# Patient Record
Sex: Female | Born: 1985 | Race: White | Hispanic: No | Marital: Single | Smoking: Never smoker
Health system: Southern US, Community
[De-identification: ages and names within clinical notes are randomized; demographics above are authoritative.]

## PROBLEM LIST (undated history)

## (undated) ENCOUNTER — Inpatient Hospital Stay (HOSPITAL_COMMUNITY): Payer: Self-pay

## (undated) DIAGNOSIS — F329 Major depressive disorder, single episode, unspecified: Secondary | ICD-10-CM

## (undated) DIAGNOSIS — D649 Anemia, unspecified: Secondary | ICD-10-CM

## (undated) DIAGNOSIS — O139 Gestational [pregnancy-induced] hypertension without significant proteinuria, unspecified trimester: Secondary | ICD-10-CM

## (undated) DIAGNOSIS — F419 Anxiety disorder, unspecified: Secondary | ICD-10-CM

## (undated) DIAGNOSIS — F32A Depression, unspecified: Secondary | ICD-10-CM

## (undated) HISTORY — PX: NO PAST SURGERIES: SHX2092

---
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
## 2006-03-30 ENCOUNTER — Emergency Department (HOSPITAL_COMMUNITY): Admission: EM | Admit: 2006-03-30 | Discharge: 2006-03-31 | Payer: Self-pay | Admitting: Emergency Medicine

## 2006-11-08 ENCOUNTER — Emergency Department (HOSPITAL_COMMUNITY): Admission: EM | Admit: 2006-11-08 | Discharge: 2006-11-08 | Payer: Self-pay | Admitting: Emergency Medicine

## 2007-08-02 ENCOUNTER — Inpatient Hospital Stay (HOSPITAL_COMMUNITY): Admission: AD | Admit: 2007-08-02 | Discharge: 2007-08-02 | Payer: Self-pay | Admitting: Obstetrics and Gynecology

## 2007-08-19 ENCOUNTER — Emergency Department (HOSPITAL_COMMUNITY): Admission: EM | Admit: 2007-08-19 | Discharge: 2007-08-19 | Payer: Self-pay | Admitting: Emergency Medicine

## 2007-09-15 ENCOUNTER — Inpatient Hospital Stay (HOSPITAL_COMMUNITY): Admission: AD | Admit: 2007-09-15 | Discharge: 2007-09-17 | Payer: Self-pay | Admitting: Obstetrics & Gynecology

## 2010-12-07 ENCOUNTER — Encounter: Payer: Self-pay | Admitting: *Deleted

## 2010-12-07 ENCOUNTER — Emergency Department (HOSPITAL_COMMUNITY)
Admission: EM | Admit: 2010-12-07 | Discharge: 2010-12-07 | Discharge status: Against Medical Advice | Payer: Self-pay | Attending: Emergency Medicine | Admitting: Emergency Medicine

## 2010-12-07 DIAGNOSIS — M542 Cervicalgia: Secondary | ICD-10-CM | POA: Insufficient documentation

## 2010-12-07 NOTE — ED Notes (Signed)
Pt reports yesterday she woke up with pain and stiffness to neck and today pain has started to radiated to right arm associated with numbness. Pt reports she can only lift right arm to 90 degree angle. Pt is able to grab things, was able to get dressed.

## 2010-12-07 NOTE — ED Notes (Signed)
Attempt to call pt x2. No answer

## 2010-12-12 ENCOUNTER — Emergency Department (HOSPITAL_COMMUNITY): Payer: Self-pay

## 2010-12-12 ENCOUNTER — Emergency Department (HOSPITAL_COMMUNITY)
Admission: EM | Admit: 2010-12-12 | Discharge: 2010-12-12 | Disposition: A | Discharge status: Home / Self Care | Payer: Self-pay | Attending: Emergency Medicine | Admitting: Emergency Medicine

## 2010-12-12 ENCOUNTER — Encounter (HOSPITAL_COMMUNITY): Payer: Self-pay | Admitting: Emergency Medicine

## 2010-12-12 DIAGNOSIS — M436 Torticollis: Secondary | ICD-10-CM | POA: Insufficient documentation

## 2010-12-12 DIAGNOSIS — R51 Headache: Secondary | ICD-10-CM | POA: Insufficient documentation

## 2010-12-12 DIAGNOSIS — M542 Cervicalgia: Secondary | ICD-10-CM | POA: Insufficient documentation

## 2010-12-12 MED ORDER — DIAZEPAM 5 MG PO TABS: 5.0000 mg | ORAL_TABLET | Freq: Four times a day (QID) | ORAL | Status: AC | PRN

## 2010-12-12 MED ORDER — DIAZEPAM 5 MG PO TABS
5.0000 mg | ORAL_TABLET | Freq: Once | ORAL | Status: AC
  Administered 2010-12-12: 5 mg via ORAL
  Filled 2010-12-12: qty 1

## 2010-12-12 NOTE — ED Provider Notes (Signed)
History     CSN: [id number] Arrival date & time: 12/12/2010  1:44 PM   First MD Initiated Contact with Patient 12/12/10 1422      Chief Complaint  Patient presents with  . Torticollis  . Headache    (Consider location/radiation/quality/duration/timing/severity/associated sxs/prior treatment) HPI Comments: Patient reports midline and right sided neck pain with radiation into right arm. Pain began 6 days ago when she woke from sleep, felt like a "crick" in her neck, which has gotten worse, and she feels a "lump" in the muscle of her right upper back.   Reports one episode of almost dropping plates when pain became so intense that her arm "went out."  Denies current weakness or tingling in right arm.  Denies MVC or recent injury to the neck. Denies fevers.  Has taken ibuprofen without improvement.    Patient is a 25 y.o. female presenting with headaches. The history is provided by the patient.  Headache     History reviewed. No pertinent past medical history.  History reviewed. No pertinent past surgical history.  No family history on file.  History  Substance Use Topics  . Smoking status: Never Smoker   . Smokeless tobacco: Never Used  . Alcohol Use: No    OB History    Grav Para Term Preterm Abortions TAB SAB Ect Mult Living                  Review of Systems  Neurological: Positive for headaches.  All other systems reviewed and are negative.    Allergies  Calamine  Home Medications   Current Outpatient Rx  Name Route Sig Dispense Refill  . IBUPROFEN 200 MG PO TABS Oral Take 800 mg by mouth every 6 (six) hours as needed. For pain      BP 116/77  Pulse 88  Temp(Src) 98.6 F (37 C) (Oral)  Resp 20  Ht 5\' 7"  (1.702 m)  Wt 200 lb (90.719 kg)  BMI 31.32 kg/m2  SpO2 99%  LMP 11/11/2010  Physical Exam  Vitals reviewed. Constitutional: She is oriented to person, place, and time. She appears well-developed and well-nourished.  HENT:  Head: Normocephalic  and atraumatic.  Neck: Neck supple. No tracheal deviation present.    Cardiovascular: Normal rate and regular rhythm.   Pulmonary/Chest: Effort normal and breath sounds normal. No stridor. No respiratory distress. She has no wheezes.  Neurological: She is alert and oriented to person, place, and time.    ED Course  Procedures (including critical care time)  Labs Reviewed - No data to display No results found.   1. Torticollis       MDM  Patient woke up with pain in neck, exacerbated by turning head to right, muscle spasms on exam.          Rise Patience, PA 12/13/10 0023

## 2010-12-12 NOTE — ED Notes (Signed)
Right neck and shoulder pain, pain is now shooting through shoulder blade, unable to take in deep breath, and has headache

## 2010-12-13 NOTE — ED Provider Notes (Signed)
Medical screening examination/treatment/procedure(s) were performed by non-physician practitioner and as supervising physician I was immediately available for consultation/collaboration.   Dayton Bailiff, MD 12/13/10 [phone]

## 2011-04-09 ENCOUNTER — Emergency Department (INDEPENDENT_AMBULATORY_CARE_PROVIDER_SITE_OTHER)
Admission: EM | Admit: 2011-04-09 | Discharge: 2011-04-09 | Disposition: A | Discharge status: Home Health | Payer: BC Managed Care – PPO | Source: Home / Self Care | Attending: Family Medicine | Admitting: Family Medicine

## 2011-04-09 ENCOUNTER — Encounter (HOSPITAL_COMMUNITY): Payer: Self-pay | Admitting: Emergency Medicine

## 2011-04-09 ENCOUNTER — Emergency Department (INDEPENDENT_AMBULATORY_CARE_PROVIDER_SITE_OTHER): Payer: BC Managed Care – PPO

## 2011-04-09 ENCOUNTER — Other Ambulatory Visit: Payer: Self-pay

## 2011-04-09 DIAGNOSIS — K2970 Gastritis, unspecified, without bleeding: Secondary | ICD-10-CM

## 2011-04-09 MED ORDER — ONDANSETRON HCL 4 MG PO TABS: 4.0000 mg | ORAL_TABLET | Freq: Three times a day (TID) | ORAL | Status: AC | PRN

## 2011-04-09 MED ORDER — SUCRALFATE 1 G PO TABS: 1.0000 g | ORAL_TABLET | Freq: Four times a day (QID) | ORAL | Status: AC

## 2011-04-09 NOTE — ED Notes (Signed)
PT HERE WITH C/O LEFT CHEST WALL SHARP INTERMITT CP RADIATING TO LEFT ARM X 2 MNTHS BUT WAS CONCERNED TODAY AFTER X 2 EPISODES COUGHING UP SPECKS BRIGHT RED BLOOD.SX CONSTANT AT REST AND SORE TO TOUCH.DENIES SOB.VSS.PT ALSO STATES HEART ATTACKS RUN IN FAMILY

## 2011-04-09 NOTE — Discharge Instructions (Signed)
Gastritis Gastritis is an irritation of the stomach. This is often caused by medications, but can be from anything that bothers the stomach. Other stomach irritants are:  Alcohol.   Caffeine.   Nicotine.   Spicy or acid foods.   Medications for pain and arthritis. Aspirin and other anti-inflammatory medicines such as ibuprofen (Advil), naproxen (Aleve), and ketoprofen (Orudis) can be highly irritating.   Emotional distress.  Symptoms of gastritis may include:  Abdominal pain.   Indigestion.   Nausea and or vomiting.   Bleeding.  Some patients with chronic gastritis and ulcers have been infected by a germ. They may need special testing. Medications which kill germs can be used to cure this condition. Treatment includes avoiding the substances mentioned above that are known to cause stomach trouble. Medications used to treat gastritis can include:  Antacids.   Medicines to control vomiting.   Acid blocking medicines.  Symptoms of gastritis usually improve within 2-3 days of starting treatment. Call your caregiver if you are not better in a few days. SEEK MEDICAL CARE IF:   You have increased stomach or chest pain.   You vomit blood.   You faint or feel lightheaded.   You cannot keep fluids down.   You pass bloody or black stools.   You develop severe back pain.  MAKE SURE YOU:   Understand these instructions.   Will watch your condition.   Will get help right away if you are not doing well or get worse.  Document Released: 01/14/2005 Document Revised: 01/03/2011 Document Reviewed: 07/02/2006 [employer] Patient Information 2012 [employer]. 

## 2011-04-10 NOTE — ED Provider Notes (Signed)
History     CSN: [id number]  Arrival date & time 04/09/11  1843   First MD Initiated Contact with Patient 04/09/11 1918      Chief Complaint  Patient presents with  . Chest Pain  . Hemoptysis    (Consider location/radiation/quality/duration/timing/severity/associated sxs/prior treatment) HPI Comments: 26 y/o female with no significant PMH. Here c/o nausea, vomitting x2 this morning with specs of blood, associated with epigastric and retrosternal burning type of pain. Symptoms for 2 days. 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 Denies diaphoresis, heart palpitations or shortness of breath. No syncope. No cough or pleuritic pain. Denies cigarett or alcohol intake. She does have intermittent 800 mg ibuprofen intake for neck pain. No melena.   History reviewed. No pertinent past medical history.  History reviewed. No pertinent past surgical history.  Family History  Problem Relation Age of Onset  . Diabetes Brother   . Heart failure Other     History  Substance Use Topics  . Smoking status: Never Smoker   . Smokeless tobacco: Never Used  . Alcohol Use: No    OB History    Grav Para Term Preterm Abortions TAB SAB Ect Mult Living                  Review of Systems  Constitutional: Positive for appetite change. Negative for fever, chills and diaphoresis.  HENT: Negative for congestion, sore throat and neck pain.   Respiratory: Negative for cough and shortness of breath.   Cardiovascular: Positive for chest pain. Negative for palpitations and leg swelling.  Gastrointestinal: Positive for nausea, vomiting and abdominal pain. Negative for diarrhea, blood in stool and abdominal distention.  Genitourinary: Negative for dysuria, frequency, flank pain, vaginal  discharge and pelvic pain.  Skin: Negative for rash.  Neurological: Negative for dizziness.    Allergies  Calamine  Home Medications   Current Outpatient Rx  Name Route Sig Dispense Refill  . IBUPROFEN 200 MG PO TABS Oral Take 800 mg by mouth every 6 (six) hours as needed. For pain    . ONDANSETRON HCL 4 MG PO TABS Oral Take 1 tablet (4 mg total) by mouth every 8 (eight) hours as needed for nausea. 10 tablet 0  . SUCRALFATE 1 G PO TABS Oral Take 1 tablet (1 g total) by mouth 4 (four) times daily. Take 30 min before meals and before bed time 28 tablet 0    BP 121/77  Pulse 90  Temp(Src) 98.7 F (37.1 C) (Oral)  Resp 16  SpO2 100%  LMP 03/14/2011  Physical Exam  Nursing note and vitals reviewed. Constitutional: She is oriented to person, place, and time. She appears well-developed and well-nourished. No distress.  HENT:  Head: Normocephalic and atraumatic.  Mouth/Throat: Oropharynx is clear and moist. No oropharyngeal exudate.  Eyes: Conjunctivae are normal. Pupils are equal, round, and reactive to light. No scleral icterus.  Neck: Normal range of motion. Neck supple. No JVD present.  Cardiovascular: Normal rate, regular rhythm, normal heart sounds and intact distal pulses.  Exam reveals no gallop.   No murmur heard. Pulmonary/Chest: Effort normal and breath sounds normal. No respiratory distress. She has no wheezes. She has no rales.  Abdominal: Soft. Bowel sounds are normal. She  exhibits no distension and no mass. There is no rebound and no guarding.       Epigastric tenderness with deep palpation. Negative Murphy's sign.  Musculoskeletal:       Left shoulder with FROM. Left arm appears neurovascularly intact.  Neurological: She is alert and oriented to person, place, and time.  Skin: No rash noted. She is not diaphoretic.    ED Course  Procedures (including critical care time)   Labs Reviewed  POCT PREGNANCY, URINE  LAB REPORT - SCANNED   Dg Chest 2  View  04/09/2011  *RADIOLOGY REPORT*  Clinical Data: Chest pain.  Hemoptysis.  Shortness of breath.  Left arm pain.  CHEST - 2 VIEW  Comparison: None.  Findings: Cardiomediastinal silhouette is within normal limits. The lungs are free of focal consolidations and pleural effusions. No edema. Visualized osseous structures have a normal appearance.  IMPRESSION: Negative exam.  Original Report Authenticated By: Patterson Hammersmith, M.D.     1. Gastritis       MDM  EKG with normal rate and rithm no ischemic changes. Impress gastritis related symptoms. Reccommended to stop ibuprofen. Neg pregnancy test.        Sharin Grave, MD 04/10/11 1025

## 2012-05-10 ENCOUNTER — Emergency Department (INDEPENDENT_AMBULATORY_CARE_PROVIDER_SITE_OTHER)
Admission: EM | Admit: 2012-05-10 | Discharge: 2012-05-10 | Disposition: A | Discharge status: Home / Self Care | Payer: BC Managed Care – PPO | Source: Home / Self Care | Attending: Family Medicine | Admitting: Family Medicine

## 2012-05-10 ENCOUNTER — Encounter (HOSPITAL_COMMUNITY): Payer: Self-pay | Admitting: *Deleted

## 2012-05-10 DIAGNOSIS — L03113 Cellulitis of right upper limb: Secondary | ICD-10-CM

## 2012-05-10 DIAGNOSIS — IMO0002 Reserved for concepts with insufficient information to code with codable children: Secondary | ICD-10-CM

## 2012-05-10 MED ORDER — LIDOCAINE HCL (PF) 1 % IJ SOLN
INTRAMUSCULAR | Status: AC
  Filled 2012-05-10: qty 5

## 2012-05-10 MED ORDER — CEFTRIAXONE SODIUM 1 G IJ SOLR
1.0000 g | Freq: Once | INTRAMUSCULAR | Status: AC
  Administered 2012-05-10: 1 g via INTRAMUSCULAR

## 2012-05-10 MED ORDER — HYDROXYZINE HCL 50 MG PO TABS: 50.0000 mg | ORAL_TABLET | Freq: Three times a day (TID) | ORAL | Status: DC | PRN

## 2012-05-10 MED ORDER — METHYLPREDNISOLONE SODIUM SUCC 125 MG IJ SOLR
125.0000 mg | Freq: Once | INTRAMUSCULAR | Status: AC
  Administered 2012-05-10: 125 mg via INTRAMUSCULAR

## 2012-05-10 MED ORDER — IBUPROFEN 600 MG PO TABS: 600.0000 mg | ORAL_TABLET | Freq: Three times a day (TID) | ORAL | Status: DC

## 2012-05-10 MED ORDER — METHYLPREDNISOLONE SODIUM SUCC 125 MG IJ SOLR
INTRAMUSCULAR | Status: AC
  Filled 2012-05-10: qty 2

## 2012-05-10 MED ORDER — CEFTRIAXONE SODIUM 1 G IJ SOLR
INTRAMUSCULAR | Status: AC
  Filled 2012-05-10: qty 10

## 2012-05-10 MED ORDER — CEPHALEXIN 500 MG PO CAPS: 500.0000 mg | ORAL_CAPSULE | Freq: Two times a day (BID) | ORAL | Status: DC

## 2012-05-10 MED ORDER — TRIAMCINOLONE ACETONIDE 0.5 % EX OINT: TOPICAL_OINTMENT | Freq: Two times a day (BID) | CUTANEOUS | Status: DC

## 2012-05-10 NOTE — ED Provider Notes (Signed)
History     CSN: [id number]  Arrival date & time 05/10/12  1217   First MD Initiated Contact with Patient 05/10/12 1326      Chief Complaint  Patient presents with  . Insect Bite    (Consider location/radiation/quality/duration/timing/severity/associated sxs/prior treatment) HPI Comments: 27 year old female nondiabetic. Here complaining of right arm redness, swelling, itchiness and pain for 24 hours. Patient states she had an insect bite as a trigger of her symptoms. No drainage. No fever or chills. No general malaise. No abdominal pain, nausea or vomiting.   History reviewed. No pertinent past medical history.  History reviewed. No pertinent past surgical history.  Family History  Problem Relation Age of Onset  . Diabetes Brother   . Heart failure Other     History  Substance Use Topics  . Smoking status: Never Smoker   . Smokeless tobacco: Never Used  . Alcohol Use: No    OB History   Grav Para Term Preterm Abortions TAB SAB Ect Mult Living                  Review of Systems  Constitutional: Negative for fever and chills.  Gastrointestinal: Negative for nausea and vomiting.  Skin: Positive for rash.  Neurological: Negative for dizziness.  All other systems reviewed and are negative.    Allergies  Calamine  Home Medications   Current Outpatient Rx  Name  Route  Sig  Dispense  Refill  . cephALEXin (KEFLEX) 500 MG capsule   Oral   Take 1 capsule (500 mg total) by mouth 2 (two) times daily.   20 capsule   0   . hydrOXYzine (ATARAX/VISTARIL) 50 MG tablet   Oral   Take 1 tablet (50 mg total) by mouth every 8 (eight) hours as needed for itching.   20 tablet   0   . ibuprofen (ADVIL,MOTRIN) 600 MG tablet   Oral   Take 1 tablet (600 mg total) by mouth 3 (three) times daily.   30 tablet   0   . triamcinolone ointment (KENALOG) 0.5 %   Topical   Apply topically 2 (two) times daily.   30 g   0     BP 130/85  Pulse 80  Temp(Src) 97.9 F (36.6  C) (Oral)  Resp 20  SpO2 100%  LMP 04/09/2012  Physical Exam  Nursing note and vitals reviewed. Constitutional: She is oriented to person, place, and time. She appears well-developed and well-nourished. No distress.  HENT:  Head: Normocephalic and atraumatic.  Cardiovascular: Normal rate, regular rhythm and normal heart sounds.   Pulmonary/Chest: Breath sounds normal.  Lymphadenopathy:    She has no cervical adenopathy.  Neurological: She is alert and oriented to person, place, and time.  Skin: Rash noted. She is not diaphoretic.  Large area of translucent pruriginous erythema and swelling and right inner forearm. Mildly tender to palpation and warm to touch. There is a central area with subtle darkening of the skin otherwise there is no pustules or skin breaks. No induration or fluctuation. No streaking. Area was marked and dated with a demographic pen.    ED Course  Procedures (including critical care time)  Labs Reviewed - No data to display No results found.   1. Cellulitis of right forearm       MDM  Erythema was marked with a demographic pen. Patient had administered Solu-Medrol 125 mg IM x1 and Rocephin 1 g IM x1.  Prescribed Keflex and hydroxyzine. Asked to return in  24 or 48 hours for recheck. Supportive care and red flags should prompt her return to medical attention discussed with patient and provided in writing.       Sharin Grave, MD 05/10/12 1747

## 2012-05-10 NOTE — ED Notes (Signed)
Pt reports being possible spider bite since yesterday on lower right arm - redness with mark in middle, streaking up arm - sensitive to touch.

## 2012-05-31 ENCOUNTER — Encounter (HOSPITAL_COMMUNITY): Payer: Self-pay | Admitting: Emergency Medicine

## 2012-05-31 ENCOUNTER — Emergency Department (INDEPENDENT_AMBULATORY_CARE_PROVIDER_SITE_OTHER)
Admission: EM | Admit: 2012-05-31 | Discharge: 2012-05-31 | Disposition: A | Discharge status: Home / Self Care | Payer: BC Managed Care – PPO | Source: Home / Self Care | Attending: Emergency Medicine | Admitting: Emergency Medicine

## 2012-05-31 DIAGNOSIS — J020 Streptococcal pharyngitis: Secondary | ICD-10-CM

## 2012-05-31 DIAGNOSIS — IMO0002 Reserved for concepts with insufficient information to code with codable children: Secondary | ICD-10-CM

## 2012-05-31 LAB — POCT RAPID STREP A: Streptococcus, Group A Screen (Direct): POSITIVE — AB

## 2012-05-31 MED ORDER — AMOXICILLIN 500 MG PO CAPS: 500.0000 mg | ORAL_CAPSULE | Freq: Three times a day (TID) | ORAL | Status: AC

## 2012-05-31 NOTE — ED Provider Notes (Addendum)
History     CSN: [id number]  Arrival date & time 05/31/12  1222   First MD Initiated Contact with Patient 05/31/12 1315      Chief Complaint  Patient presents with  . Sore Throat    sore throat x 3 days. throat is dry and itchy with white patches. neck sorness. itcy ears    (Consider location/radiation/quality/duration/timing/severity/associated sxs/prior treatment) HPI Comments: Patient presents urgent care describing that for about 3 days she's been having a sore throat of her throat is itchy and swollen with white patches and hurts even more this morning swallow. Her neck feels sore (patient points to right anterior cervical area). Feeling somewhat tired fatigued, her youngest son is also having similar symptoms. She has taken some ibuprofen for her discomfort  Patient is a 27 y.o. female presenting with pharyngitis. The history is provided by the patient.  Sore Throat This is a new problem. The problem occurs constantly. Pertinent negatives include no abdominal pain, no headaches and no shortness of breath. The symptoms are aggravated by swallowing. Nothing relieves the symptoms. She has tried nothing for the symptoms. The treatment provided no relief.    History reviewed. No pertinent past medical history.  History reviewed. No pertinent past surgical history.  Family History  Problem Relation Age of Onset  . Diabetes Brother   . Heart failure Other     History  Substance Use Topics  . Smoking status: Never Smoker   . Smokeless tobacco: Never Used  . Alcohol Use: No    OB History   Grav Para Term Preterm Abortions TAB SAB Ect Mult Living                  Review of Systems  Constitutional: Negative for fever, chills, appetite change and fatigue.  HENT: Positive for sore throat and trouble swallowing. Negative for ear pain, facial swelling, neck pain, neck stiffness, postnasal drip and sinus pressure.   Eyes: Negative for pain.  Respiratory: Negative for shortness  of breath.   Gastrointestinal: Negative for abdominal pain.  Skin: Negative for rash.  Neurological: Negative for headaches.    Allergies  Calamine  Home Medications   Current Outpatient Rx  Name  Route  Sig  Dispense  Refill  . amoxicillin (AMOXIL) 500 MG capsule   Oral   Take 1 capsule (500 mg total) by mouth 3 (three) times daily.   30 capsule   0   . ibuprofen (ADVIL,MOTRIN) 600 MG tablet   Oral   Take 1 tablet (600 mg total) by mouth 3 (three) times daily.   30 tablet   0   . triamcinolone ointment (KENALOG) 0.5 %   Topical   Apply topically 2 (two) times daily.   30 g   0     BP 123/80  Pulse 94  Temp(Src) 99.4 F (37.4 C) (Oral)  Resp 18  SpO2 100%  LMP 05/17/2012  Physical Exam  Nursing note and vitals reviewed. Constitutional: Vital signs are normal. She appears well-developed and well-nourished.  Non-toxic appearance. She does not have a sickly appearance. She does not appear ill. No distress.  HENT:  Head: Normocephalic.  Right Ear: Tympanic membrane normal.  Left Ear: Tympanic membrane normal.  Mouth/Throat: Oropharyngeal exudate present.  Eyes: Conjunctivae are normal. Right eye exhibits no discharge. Left eye exhibits no discharge. No scleral icterus.  Neck: Trachea normal. Neck supple. No JVD present. No thyromegaly present.  Lymphadenopathy:    She has cervical adenopathy.  Right cervical: Superficial cervical adenopathy present.       Left cervical: Superficial cervical adenopathy present.  Skin: No rash noted. No erythema.    ED Course  Procedures (including critical care time)  Labs Reviewed  POCT RAPID STREP A ([facility] ONLY) - Abnormal; Notable for the following:    Streptococcus, Group A Screen (Direct) POSITIVE (*)    All other components within normal limits   No results found.   1. Pharyngitis due to Streptoccus pyogenes       MDM  Uncomplicated streptococcal pharyngitis. Patient will be treated with a course  of amoxicillin for 10 days, she has a child that has also been treated despite negative strep test. Based on center criteria and clinical suspicion mother was informed of such rationale, she agreed.        Jimmie Molly, MD 05/31/12 1355  Jimmie Molly, MD 05/31/12 1356  Jimmie Molly, MD 05/31/12 1356

## 2012-05-31 NOTE — ED Notes (Signed)
Pt c/o sore throat. "throat is itchy and very dry" white patches.  Hurts to swallow. Neck soreness. Pt has not tried any otc meds for treatment. Denies any other symptoms. Symptoms present x 3 days

## 2012-07-05 ENCOUNTER — Emergency Department: Payer: Self-pay | Admitting: Unknown Physician Specialty

## 2012-09-17 LAB — OB RESULTS CONSOLE HEPATITIS B SURFACE ANTIGEN: Hepatitis B Surface Ag: NEGATIVE

## 2012-09-17 LAB — OB RESULTS CONSOLE RPR: RPR: NONREACTIVE

## 2012-09-17 LAB — OB RESULTS CONSOLE RUBELLA ANTIBODY, IGM: Rubella: IMMUNE

## 2012-09-17 LAB — OB RESULTS CONSOLE GC/CHLAMYDIA
Chlamydia: NEGATIVE
GC PROBE AMP, GENITAL: NEGATIVE

## 2012-09-17 LAB — OB RESULTS CONSOLE ABO/RH: RH TYPE: POSITIVE

## 2012-09-17 LAB — OB RESULTS CONSOLE HIV ANTIBODY (ROUTINE TESTING): HIV: NONREACTIVE

## 2012-10-13 ENCOUNTER — Emergency Department (HOSPITAL_COMMUNITY)
Admission: EM | Admit: 2012-10-13 | Discharge: 2012-10-13 | Disposition: A | Discharge status: Home / Self Care | Payer: Medicaid Other | Attending: Emergency Medicine | Admitting: Emergency Medicine

## 2012-10-13 ENCOUNTER — Encounter (HOSPITAL_COMMUNITY): Payer: Self-pay | Admitting: *Deleted

## 2012-10-13 DIAGNOSIS — R05 Cough: Secondary | ICD-10-CM

## 2012-10-13 DIAGNOSIS — Z79899 Other long term (current) drug therapy: Secondary | ICD-10-CM | POA: Insufficient documentation

## 2012-10-13 DIAGNOSIS — J069 Acute upper respiratory infection, unspecified: Secondary | ICD-10-CM

## 2012-10-13 DIAGNOSIS — R059 Cough, unspecified: Secondary | ICD-10-CM | POA: Insufficient documentation

## 2012-10-13 DIAGNOSIS — O9989 Other specified diseases and conditions complicating pregnancy, childbirth and the puerperium: Secondary | ICD-10-CM | POA: Insufficient documentation

## 2012-10-13 NOTE — ED Notes (Signed)
Reports having productive cough x 1 week with yellow sputum production. Airway intact.

## 2012-10-13 NOTE — ED Provider Notes (Signed)
CSN: [id number]     Arrival date & time 10/13/12  1858 History   This chart was scribed for non-physician practitioner, Johnnette Gourd, PA, working with Donnetta Hutching, MD, by [facility] ED Scribe. This patient was seen in room TR07C/TR07C and the patient's care was started at 7:15 PM.  Chief Complaint  Patient presents with  . Cough    The history is provided by the patient. No language interpreter was used.    HPI Comments: Caroline Taylor is a 27 y.o. Female, who is [redacted] weeks pregnant who presents to the Emergency Department complaining of a cough productive of yellow mucus over the past week. She reports associated chest congestion, subjective fever and chills over the past week. Pt has taken OTC cough medication with minimal relief. Ob/gyn told her this cough medication was safe in her pregnancy. Pt states she has experienced wheezing depending on cough.   History reviewed. No pertinent past medical history. History reviewed. No pertinent past surgical history.  Family History  Problem Relation Age of Onset  . Diabetes Brother   . Heart failure Other    History  Substance Use Topics  . Smoking status: Never Smoker   . Smokeless tobacco: Never Used  . Alcohol Use: No   OB History   Grav Para Term Preterm Abortions TAB SAB Ect Mult Living   2         1     Review of Systems  Constitutional: Positive for fever and chills.  HENT: Positive for congestion.   Respiratory: Positive for cough.   All other systems reviewed and are negative.   Allergies  Calamine  Home Medications   Current Outpatient Rx  Name  Route  Sig  Dispense  Refill  . Prenatal Vit-Fe Fumarate-FA (PRENATAL MULTIVITAMIN) TABS tablet   Oral   Take 1 tablet by mouth daily.          Triage Vitals: P 133/83  Pulse 95  Temp(Src) 98.7 F (37.1 C) (Oral)  Resp 18  SpO2 98%  LMP 05/17/2012  Physical Exam  Nursing note and vitals reviewed. Constitutional: She is oriented to person, place, and time. She  appears well-developed and well-nourished. No distress.  HENT:  Head: Normocephalic and atraumatic.  Mouth/Throat: Oropharynx is clear and moist.  Mucosal edema and clear rhinorrhea. Postnasal drip. No adenopathy.   Eyes: Conjunctivae and EOM are normal.  Neck: Normal range of motion. Neck supple.  Cardiovascular: Normal rate, regular rhythm and normal heart sounds.   Pulmonary/Chest: Effort normal and breath sounds normal. No respiratory distress. She has no wheezes. She has no rales. She exhibits no tenderness.  Musculoskeletal: Normal range of motion. She exhibits no edema.  Neurological: She is alert and oriented to person, place, and time. No sensory deficit.  Skin: Skin is warm and dry.  Psychiatric: She has a normal mood and affect. Her behavior is normal.    ED Course  Procedures (including critical care time)  DIAGNOSTIC STUDIES: Oxygen Saturation is 98% on RA, normal by my interpretation.    COORDINATION OF CARE: 7:20 PM- Pt advised of plan to rest and to use nasal saline. Pt agrees with plan.  Labs Review Labs Reviewed - No data to display Imaging Review No results found.  MDM   1. Cough   2. URI (upper respiratory infection)    Patient with URI. She is well appearing and in no apparent distress with normal vital signs. She is [redacted] weeks pregnant. I advised her to  rest, increase fluids, use nasal saline rinses. She can continue using cough medication was approved by the OB/GYN. Return precautions discussed. Patient states understanding of plan and is agreeable.   I personally performed the services described in this documentation, which was scribed in my presence. The recorded information has been reviewed and is accurate.    Trevor Mace, PA-C 10/13/12 2336

## 2012-10-14 NOTE — ED Provider Notes (Signed)
Medical screening examination/treatment/procedure(s) were performed by non-physician practitioner and as supervising physician I was immediately available for consultation/collaboration.  Donnetta Hutching, MD 10/14/12 [phone]

## 2013-01-13 LAB — OB RESULTS CONSOLE RPR: RPR: NONREACTIVE

## 2013-01-28 NOTE — L&D Delivery Note (Signed)
Patient was C/C/+2 and pushed for <5 minutes with epidural.   NSVD  female infant, Apgars 9/9, weight pending.   The patient had bilateral periurethral abrasions not requiring repair and a 1st degree Right vaginal repaired with 3-0 vicryl. Fundus was firm. EBL was estimated to be 600cc, she received 0.2mg  methergine IM x 1 dose and 1000mcg cytotec rectally; manual evacuation of clot was performed with resolution of heavy bleeding. Placenta was delivered intact. Vagina was clear.  Baby was vigorous and doing skin to skin with mother.  Philip AspenALLAHAN, Ralphine Hinks

## 2013-03-28 ENCOUNTER — Inpatient Hospital Stay (HOSPITAL_COMMUNITY)
Admission: AD | Admit: 2013-03-28 | Discharge: 2013-03-28 | Disposition: A | Discharge status: Home / Self Care | Payer: Medicaid Other | Source: Ambulatory Visit | Attending: Obstetrics and Gynecology | Admitting: Obstetrics and Gynecology

## 2013-03-28 ENCOUNTER — Encounter (HOSPITAL_COMMUNITY): Payer: Self-pay | Admitting: *Deleted

## 2013-03-28 DIAGNOSIS — O9989 Other specified diseases and conditions complicating pregnancy, childbirth and the puerperium: Principal | ICD-10-CM

## 2013-03-28 DIAGNOSIS — O99891 Other specified diseases and conditions complicating pregnancy: Secondary | ICD-10-CM | POA: Insufficient documentation

## 2013-03-28 DIAGNOSIS — O139 Gestational [pregnancy-induced] hypertension without significant proteinuria, unspecified trimester: Secondary | ICD-10-CM | POA: Insufficient documentation

## 2013-03-28 DIAGNOSIS — W009XXA Unspecified fall due to ice and snow, initial encounter: Secondary | ICD-10-CM

## 2013-03-28 DIAGNOSIS — R109 Unspecified abdominal pain: Secondary | ICD-10-CM | POA: Insufficient documentation

## 2013-03-28 DIAGNOSIS — W010XXA Fall on same level from slipping, tripping and stumbling without subsequent striking against object, initial encounter: Secondary | ICD-10-CM | POA: Insufficient documentation

## 2013-03-28 DIAGNOSIS — H538 Other visual disturbances: Secondary | ICD-10-CM

## 2013-03-28 LAB — URINALYSIS, ROUTINE W REFLEX MICROSCOPIC
Bilirubin Urine: NEGATIVE
Glucose, UA: NEGATIVE mg/dL
HGB URINE DIPSTICK: NEGATIVE
Ketones, ur: NEGATIVE mg/dL
LEUKOCYTES UA: NEGATIVE
NITRITE: NEGATIVE
Protein, ur: NEGATIVE mg/dL
UROBILINOGEN UA: 1 mg/dL (ref 0.0–1.0)
pH: 6 (ref 5.0–8.0)

## 2013-03-28 LAB — COMPREHENSIVE METABOLIC PANEL
ALBUMIN: 2.3 g/dL — AB (ref 3.5–5.2)
ALK PHOS: 139 U/L — AB (ref 39–117)
ALT: 9 U/L (ref 0–35)
AST: 16 U/L (ref 0–37)
BILIRUBIN TOTAL: 0.3 mg/dL (ref 0.3–1.2)
BUN: 11 mg/dL (ref 6–23)
CHLORIDE: 101 meq/L (ref 96–112)
CO2: 23 meq/L (ref 19–32)
Calcium: 8.9 mg/dL (ref 8.4–10.5)
Creatinine, Ser: 0.78 mg/dL (ref 0.50–1.10)
GFR calc Af Amer: >90 mL/min (ref >90)
Glucose, Bld: 91 mg/dL (ref 70–99)
POTASSIUM: 4.6 meq/L (ref 3.7–5.3)
SODIUM: 135 meq/L — AB (ref 137–147)
Total Protein: 6.1 g/dL (ref 6.0–8.3)

## 2013-03-28 LAB — CBC
HCT: 32.7 % — ABNORMAL LOW (ref 36.0–46.0)
Hemoglobin: 10.5 g/dL — ABNORMAL LOW (ref 12.0–15.0)
MCH: 27.9 pg (ref 26.0–34.0)
MCHC: 32.1 g/dL (ref 30.0–36.0)
MCV: 87 fL (ref 78.0–100.0)
PLATELETS: 256 10*3/uL (ref 150–400)
RBC: 3.76 MIL/uL — AB (ref 3.87–5.11)
RDW: 14 % (ref 11.5–15.5)
WBC: 8.2 10*3/uL (ref 4.0–10.5)

## 2013-03-28 LAB — LACTATE DEHYDROGENASE: LDH: 199 U/L (ref 94–250)

## 2013-03-28 LAB — URIC ACID: Uric Acid, Serum: 6.4 mg/dL (ref 2.4–7.0)

## 2013-03-28 NOTE — MAU Provider Note (Signed)
Chief Complaint:  Contractions, Fall and Blurred Vision   First Provider Initiated Contact with Patient 03/28/13 1817      HPI: Caroline Taylor is a 28 y.o. G2P1 at 60w1dwho presents to maternity admissions reporting onset of bilateral sharp intermittent lower abdominal pain this morning, a fall outside on wet/ice at 3pm today, and report of seeing spots x3 weeks.  She caught herself on her hands and knees during the fall and did not hit her abdomen.  She denies h/a or epigastric pain and reports she has had normal blood pressures so far in her pregnancy.  She reports good fetal movement, denies LOF, vaginal bleeding, vaginal itching/burning, urinary symptoms, h/a, dizziness, n/v, or fever/chills.    Past Medical History: History reviewed. No pertinent past medical history.  Past obstetric history: OB History  Gravida Para Term Preterm AB SAB TAB Ectopic Multiple Living  2 1        1     # Outcome Date GA Lbr Len/2nd Weight Sex Delivery Anes PTL Lv  2 CUR           1 PAR 09/15/07    M SVD EPI  Y      Past Surgical History: History reviewed. No pertinent past surgical history.  Family History: Family History  Problem Relation Age of Onset  . Diabetes Brother   . Heart failure Other     Social History: History  Substance Use Topics  . Smoking status: Never Smoker   . Smokeless tobacco: Never Used  . Alcohol Use: No    Allergies:  Allergies  Allergen Reactions  . Calamine Rash    Meds:  Prescriptions prior to admission  Medication Sig Dispense Refill  . ibuprofen (ADVIL,MOTRIN) 200 MG tablet Take 200 mg by mouth every 6 (six) hours as needed for headache.      . Prenatal Vit-Fe Fumarate-FA (PRENATAL MULTIVITAMIN) TABS tablet Take 1 tablet by mouth daily.        ROS: Pertinent findings in history of present illness.  Physical Exam  Blood pressure 130/79, pulse 76, temperature 98 F (36.7 C), temperature source Oral, resp. rate 18, height 5\' 7"  (1.702 m), weight 120.203  kg (265 lb), last menstrual period 07/18/2012. GENERAL: Well-developed, well-nourished female in no acute distress.  HEENT: normocephalic HEART: normal rate RESP: normal effort ABDOMEN: Soft, non-tender, gravid appropriate for gestational age EXTREMITIES: Nontender, no edema NEURO: alert and oriented Dilation: Closed Effacement (%): 30 Cervical Position: Posterior Station: -3 Exam by:: L. Munford RN FHT:  Baseline 130, moderate variability, accelerations present, no decelerations Contractions: None on toco or to palpation   Labs: Results for orders placed during the hospital encounter of 03/28/13 (from the past 24 hour(s))  URINALYSIS, ROUTINE W REFLEX MICROSCOPIC     Status: Abnormal   Collection Time    03/28/13  5:46 PM      Result Value Ref Range   Color, Urine YELLOW  YELLOW   APPearance CLEAR  CLEAR   Specific Gravity, Urine >1.030 (*) 1.005 - 1.030   pH 6.0  5.0 - 8.0   Glucose, UA NEGATIVE  NEGATIVE mg/dL   Hgb urine dipstick NEGATIVE  NEGATIVE   Bilirubin Urine NEGATIVE  NEGATIVE   Ketones, ur NEGATIVE  NEGATIVE mg/dL   Protein, ur NEGATIVE  NEGATIVE mg/dL   Urobilinogen, UA 1.0  0.0 - 1.0 mg/dL   Nitrite NEGATIVE  NEGATIVE   Leukocytes, UA NEGATIVE  NEGATIVE  CBC     Status: Abnormal   Collection  Time    03/28/13  6:25 PM      Result Value Ref Range   WBC 8.2  4.0 - 10.5 K/uL   RBC 3.76 (*) 3.87 - 5.11 MIL/uL   Hemoglobin 10.5 (*) 12.0 - 15.0 g/dL   HCT 16.132.7 (*) 09.636.0 - 04.546.0 %   MCV 87.0  78.0 - 100.0 fL   MCH 27.9  26.0 - 34.0 pg   MCHC 32.1  30.0 - 36.0 g/dL   RDW 40.914.0  81.111.5 - 91.415.5 %   Platelets 256  150 - 400 K/uL  COMPREHENSIVE METABOLIC PANEL     Status: Abnormal   Collection Time    03/28/13  6:25 PM      Result Value Ref Range   Sodium 135 (*) 137 - 147 mEq/L   Potassium 4.6  3.7 - 5.3 mEq/L   Chloride 101  96 - 112 mEq/L   CO2 23  19 - 32 mEq/L   Glucose, Bld 91  70 - 99 mg/dL   BUN 11  6 - 23 mg/dL   Creatinine, Ser 7.820.78  0.50 - 1.10 mg/dL    Calcium 8.9  8.4 - 95.610.5 mg/dL   Total Protein 6.1  6.0 - 8.3 g/dL   Albumin 2.3 (*) 3.5 - 5.2 g/dL   AST 16  0 - 37 U/L   ALT 9  0 - 35 U/L   Alkaline Phosphatase 139 (*) 39 - 117 U/L   Total Bilirubin 0.3  0.3 - 1.2 mg/dL   GFR calc non Af Amer >90  >90 mL/min   GFR calc Af Amer >90  >90 mL/min  URIC ACID     Status: None   Collection Time    03/28/13  6:25 PM      Result Value Ref Range   Uric Acid, Serum 6.4  2.4 - 7.0 mg/dL  LACTATE DEHYDROGENASE     Status: None   Collection Time    03/28/13  6:25 PM      Result Value Ref Range   LDH 199  94 - 250 U/L      Assessment: 1. Blurred vision, bilateral   2. Fall due to ice or snow   3. Transient hypertension of pregnancy     Plan: Consult Dr Dareen PianoAnderson Discharge home Labor precautions and fetal kick counts Keep scheduled appointment this week in office Return to MAU as needed      Follow-up Information   Follow up with Levi AlandANDERSON,MARK E, MD. (As scheduled)    Specialty:  Obstetrics and Gynecology   Contact information:   [address] [place] [id number] [phone]        Medication List         ibuprofen 200 MG tablet  Commonly known as:  ADVIL,MOTRIN  Take 200 mg by mouth every 6 (six) hours as needed for headache.     prenatal multivitamin Tabs tablet  Take 1 tablet by mouth daily.        Sharen CounterLisa Leftwich-Kirby Certified Nurse-Midwife 03/28/2013 8:19 PM

## 2013-03-28 NOTE — Discharge Instructions (Signed)
Injuries In Pregnancy °Trauma is the most common cause of injury and death in pregnant women. The most common cause of death to the fetus is injury and death of the pregnant mother.  °Minor falls and minor automobile accidents do not usually harm the fetus. The fetus is protected in the womb by a sac filled with fluid. The fetus can be harmed if there is direct trauma to your abdomen and pelvis. Direct trauma to the uterus and placenta can affect the blood supply to the fetus. Major trauma causing significant bleeding and shock to the mother can also compromise and jeopardize the fetal blood supply. °It is important to know your blood type and the father's blood type in case you develop vaginal bleeding. If you are RH negative and have sustained serious trauma or develop vaginal bleeding, you will need to have medicine (RhoGAM [Rh immune globulin]) to avoid Rh problems in future pregnancies. °CAUSES °· Falls are more common in the second and third trimester of the pregnancy. Factors that increase your risk of falling include: °· Increase in weight. °· The change of your center of gravity. °· Tripping over an object that cannot be seen. °· Automobile accidents. It is important to wear a seat belt and always practice safe driving. °· Domestic violence or assault. Dial your local emergency services (911 in the [place]). Spousal abuse can be a significant cause of trauma during pregnancy. °· Burns (fire or electrical). Avoid fires, starting fires, lifting heavy pots of boiling or hot liquids, and fixing electrical problems. °The most common causes of death to the pregnant woman include: °· Injuries that cause severe bleeding, shock and loss of blood flow to the mother's major organs. °· Head and neck injuries that result in severe brain or spinal damage. °· Chest trauma that can cause direct injury to the heart and lungs or any injury that effects the area enclosed by the ribs (thorax). Trauma to this area can result in  cardio-respiratory arrest. °Symptoms and treatment will depend on the type of injury. °HOME CARE INSTRUCTIONS  °· Call your caregiver if you are in a car accident, even if you think you and the baby are not hurt. Your caregiver may want you to have a precautionary evaluation. °· Do not take aspirin. It can worsen bleeding. °· You may apply cold packs 3 to 4 times a day to the injury with your caregiver's permission. °· After 24 hours apply warm compresses to the injured site with your caregiver's permission. °· Call your caregiver if you are having increasing pain in any part of your body that is not remedied by your instructed home care. °· In a severe injury, try to have someone be with you and help you until you are able to take care of yourself. °· Do not wear high heel shoes while pregnant. °· Remove slippery rugs and loose objects on the floor. °SEEK IMMEDIATE MEDICAL CARE IF:  °· You have been assaulted (domestic or otherwise). °· You have been in a car accident. °· You develop vaginal bleeding. °· You develop fluid leaking from the vagina. °· You develop uterine contractions (pelvic cramping or pain). °· You develop neck stiffness or pain. °· You become weak or faint, or have uncontrolled vomiting after trauma. °· You had a serious burn. This includes burns to the face, neck, hands or genitals, or burns greater than the size of your palm anywhere else. °· You develop a headache or vision problems after a fall or from   other trauma. °· You do not feel the baby moving or the baby is not moving as much as before. °Document Released: 02/22/2004 Document Revised: 04/08/2011 Document Reviewed: 10/21/2012 °[employer]® Patient Information ©2014 [employer]. ° °

## 2013-03-28 NOTE — MAU Note (Signed)
Pt presents with complaints of falling at home and she states that she caught herself and landed on her hands and knees. She says she has been having contractions today and seeing spots.

## 2013-03-29 LAB — OB RESULTS CONSOLE GBS: GBS: NEGATIVE

## 2013-04-09 ENCOUNTER — Inpatient Hospital Stay (HOSPITAL_COMMUNITY)
Admission: AD | Admit: 2013-04-09 | Discharge: 2013-04-09 | Disposition: A | Discharge status: Home / Self Care | Payer: Medicaid Other | Source: Ambulatory Visit | Attending: Obstetrics and Gynecology | Admitting: Obstetrics and Gynecology

## 2013-04-09 ENCOUNTER — Encounter (HOSPITAL_COMMUNITY): Payer: Self-pay

## 2013-04-09 DIAGNOSIS — R55 Syncope and collapse: Secondary | ICD-10-CM

## 2013-04-09 DIAGNOSIS — E860 Dehydration: Secondary | ICD-10-CM | POA: Insufficient documentation

## 2013-04-09 DIAGNOSIS — R42 Dizziness and giddiness: Secondary | ICD-10-CM | POA: Insufficient documentation

## 2013-04-09 DIAGNOSIS — O2650 Maternal hypotension syndrome, unspecified trimester: Secondary | ICD-10-CM | POA: Insufficient documentation

## 2013-04-09 LAB — URINALYSIS, ROUTINE W REFLEX MICROSCOPIC
BILIRUBIN URINE: NEGATIVE
Bilirubin Urine: NEGATIVE
GLUCOSE, UA: NEGATIVE mg/dL
GLUCOSE, UA: NEGATIVE mg/dL
HGB URINE DIPSTICK: NEGATIVE
Hgb urine dipstick: NEGATIVE
KETONES UR: NEGATIVE mg/dL
KETONES UR: NEGATIVE mg/dL
LEUKOCYTES UA: NEGATIVE
Nitrite: NEGATIVE
Nitrite: NEGATIVE
PH: 6 (ref 5.0–8.0)
PROTEIN: NEGATIVE mg/dL
Protein, ur: 30 mg/dL — AB
Specific Gravity, Urine: >1.03 — ABNORMAL HIGH (ref 1.005–1.030)
UROBILINOGEN UA: 0.2 mg/dL (ref 0.0–1.0)
Urobilinogen, UA: 0.2 mg/dL (ref 0.0–1.0)
pH: 5.5 (ref 5.0–8.0)

## 2013-04-09 LAB — URINE MICROSCOPIC-ADD ON

## 2013-04-09 NOTE — MAU Provider Note (Signed)
History     CSN: [id number]  Arrival date and time: 04/09/13 [id number]   First Provider Initiated Contact with Patient 04/09/13 1914      Chief Complaint  Patient presents with  . Emesis  . Shortness of Breath  . Dizziness   HPI Caroline Taylor is 28 y.o. G2P1 3857w6d weeks presenting with lightheaded, shaky, [facility]  She did not fall, no injury.  She had eaten and had po fluids today.  Denies vaginal discharge, leaking of fluid or vaginal bleeding.  Report diarrhea and N/V X 3 days.  Denies fever but has had chills.  Has Braxton-Hicks contractions on and off, actually less today than the last 2 days.  Denies headache, chest pain, visual changes.  Does have swelling in her ankles/feet.  Her Mother thinks she had an panic attack but the patient doesn't feel like she did.    History reviewed. No pertinent past medical history.  History reviewed. No pertinent past surgical history.  Family History  Problem Relation Age of Onset  . Diabetes Brother   . Heart failure Other     History  Substance Use Topics  . Smoking status: Never Smoker   . Smokeless tobacco: Never Used  . Alcohol Use: No    Allergies:  Allergies  Allergen Reactions  . Calamine Rash    Prescriptions prior to admission  Medication Sig Dispense Refill  . calcium carbonate (TUMS - DOSED IN MG ELEMENTAL CALCIUM) 500 MG chewable tablet Chew 1 tablet by mouth daily as needed for indigestion or heartburn.      Marland Kitchen. ibuprofen (ADVIL,MOTRIN) 200 MG tablet Take 200 mg by mouth every 6 (six) hours as needed for headache.      . Prenatal Vit-Fe Fumarate-FA (PRENATAL MULTIVITAMIN) TABS tablet Take 1 tablet by mouth daily.        Review of Systems  Constitutional: Positive for chills. Negative for fever.  Eyes: Negative for blurred vision.  Cardiovascular:       + for ankles and feet edema  Gastrointestinal: Positive for nausea, vomiting and diarrhea. Negative for abdominal pain  (Braxton Hicks contractions for 3 days but less today.).  Genitourinary: Negative for dysuria, urgency and frequency.       Neg for vaginal bleeding or loss of fluid.   Neurological: Negative for headaches.   Physical Exam   Blood pressure 135/72, pulse 90, temperature 98.5 F (36.9 C), temperature source Oral, resp. rate 18, height 5' 6.5" (1.689 m), weight 268 lb (121.564 kg), last menstrual period 07/18/2012, SpO2 100.00%.  Physical Exam   Results for orders placed during the hospital encounter of 04/09/13 (from the past 48 hour(s))  URINALYSIS, ROUTINE W REFLEX MICROSCOPIC     Status: Abnormal   Collection Time    04/09/13  6:45 PM      Result Value Ref Range   Color, Urine YELLOW  YELLOW   APPearance CLEAR  CLEAR   Specific Gravity, Urine >1.030 (*) 1.005 - 1.030   pH 6.0  5.0 - 8.0   Glucose, UA NEGATIVE  NEGATIVE mg/dL   Hgb urine dipstick NEGATIVE  NEGATIVE   Bilirubin Urine NEGATIVE  NEGATIVE   Ketones, ur NEGATIVE  NEGATIVE mg/dL   Protein, ur 30 (*) NEGATIVE mg/dL   Urobilinogen, UA 0.2  0.0 - 1.0 mg/dL   Nitrite NEGATIVE  NEGATIVE   Leukocytes, UA NEGATIVE  NEGATIVE  URINE MICROSCOPIC-ADD ON     Status: Abnormal  Collection Time    04/09/13  6:45 PM      Result Value Ref Range   Squamous Epithelial / LPF MANY (*) RARE   WBC, UA 7-10  <3 WBC/hpf   RBC / HPF 0-2  <3 RBC/hpf   Bacteria, UA MANY (*) RARE   Urine-Other MUCOUS PRESENT     MAU Course  Procedures  MDM Reported MSE to Dr. Henderson Cloud at 19:47.  Order given for serial blood pressure, check cervix, repeat UA to see if protein clears and send urine culture.  20:00 Care turned over to [facility], CNM  Assessment and Plan    KEY,EVE M 04/09/2013, 7:34 PM   S:  Pt tolerated PO hydration well.  Pt reports good fetal movement, denies ctx, LOF or vaginal bleeding.    O: Patient Vitals for the past 24 hrs:  BP Temp Temp src Pulse Resp SpO2 Height Weight  04/09/13 2242 132/85 mmHg - - 79 18 - -  -  04/09/13 2241 132/85 mmHg - - - 18 - - -  04/09/13 2148 132/85 mmHg - - 79 - - - -  04/09/13 2133 132/85 mmHg - - 69 - - - -  04/09/13 2118 133/87 mmHg - - 81 - - - -  04/09/13 2103 126/80 mmHg - - 81 - - - -  04/09/13 2048 129/80 mmHg - - 84 - - - -  04/09/13 2033 130/84 mmHg - - 74 - - - -  04/09/13 2018 131/88 mmHg - - 87 - - - -  04/09/13 2010 133/89 mmHg - - 74 - - - -  04/09/13 1831 135/72 mmHg 98.5 F (36.9 C) Oral 90 18 100 % 5' 6.5" (1.689 m) 121.564 kg (268 lb)   Repeat urinalysis following oral hydration:  URINALYSIS, ROUTINE W REFLEX MICROSCOPIC     Status: Abnormal   Collection Time    04/09/13  9:05 PM      Result Value Ref Range   Color, Urine YELLOW  YELLOW   APPearance CLOUDY (*) CLEAR   Specific Gravity, Urine >1.030 (*) 1.005 - 1.030   pH 5.5  5.0 - 8.0   Glucose, UA NEGATIVE  NEGATIVE mg/dL   Hgb urine dipstick NEGATIVE  NEGATIVE   Bilirubin Urine NEGATIVE  NEGATIVE   Ketones, ur NEGATIVE  NEGATIVE mg/dL   Protein, ur NEGATIVE  NEGATIVE mg/dL   Urobilinogen, UA 0.2  0.0 - 1.0 mg/dL   Nitrite NEGATIVE  NEGATIVE   Leukocytes, UA TRACE (*) NEGATIVE  URINE MICROSCOPIC-ADD ON     Status: Abnormal   Collection Time    04/09/13  9:05 PM      Result Value Ref Range   Squamous Epithelial / LPF MANY (*) RARE   WBC, UA 3-6  <3 WBC/hpf   RBC / HPF 0-2  <3 RBC/hpf   Bacteria, UA RARE  RARE    A: 1. Near syncope   2. Mild dehydration     P: Consult Dr Henderson Cloud D/C home with preeclampsia and labor precautions Increase PO fluids F/U in office on Monday as scheduled Return to MAU as needed   Sharen Counter Certified Nurse-Midwife

## 2013-04-09 NOTE — MAU Note (Signed)
nasuea Vomiting past few days. Diarrhea started yesterday.  Feels weak, light headed and short of breath.  Baby not movign as much today.

## 2013-04-09 NOTE — Discharge Instructions (Signed)
Dehydration, Adult Dehydration is when you lose more fluids from the body than you take in. Vital organs like the kidneys, brain, and heart cannot function without a proper amount of fluids and salt. Any loss of fluids from the body can cause dehydration.  CAUSES   Vomiting.  Diarrhea.  Excessive sweating.  Excessive urine output.  Fever. SYMPTOMS  Mild dehydration  Thirst.  Dry lips.  Slightly dry mouth. Moderate dehydration  Very dry mouth.  Sunken eyes.  Skin does not bounce back quickly when lightly pinched and released.  Dark urine and decreased urine production.  Decreased tear production.  Headache. Severe dehydration  Very dry mouth.  Extreme thirst.  Rapid, weak pulse (more than 100 beats per minute at rest).  Cold hands and feet.  Not able to sweat in spite of heat and temperature.  Rapid breathing.  Blue lips.  Confusion and lethargy.  Difficulty being awakened.  Minimal urine production.  No tears. DIAGNOSIS  Your caregiver will diagnose dehydration based on your symptoms and your exam. Blood and urine tests will help confirm the diagnosis. The diagnostic evaluation should also identify the cause of dehydration. TREATMENT  Treatment of mild or moderate dehydration can often be done at home by increasing the amount of fluids that you drink. It is best to drink small amounts of fluid more often. Drinking too much at one time can make vomiting worse. Refer to the home care instructions below. Severe dehydration needs to be treated at the hospital where you will probably be given intravenous (IV) fluids that contain water and electrolytes. HOME CARE INSTRUCTIONS   Ask your caregiver about specific rehydration instructions.  Drink enough fluids to keep your urine clear or pale yellow.  Drink small amounts frequently if you have nausea and vomiting.  Eat as you normally do.  Avoid:  Foods or drinks high in sugar.  Carbonated  drinks.  Juice.  Extremely hot or cold fluids.  Drinks with caffeine.  Fatty, greasy foods.  Alcohol.  Tobacco.  Overeating.  Gelatin desserts.  Wash your hands well to avoid spreading bacteria and viruses.  Only take over-the-counter or prescription medicines for pain, discomfort, or fever as directed by your caregiver.  Ask your caregiver if you should continue all prescribed and over-the-counter medicines.  Keep all follow-up appointments with your caregiver. SEEK MEDICAL CARE IF:  You have abdominal pain and it increases or stays in one area (localizes).  You have a rash, stiff neck, or severe headache.  You are irritable, sleepy, or difficult to awaken.  You are weak, dizzy, or extremely thirsty. SEEK IMMEDIATE MEDICAL CARE IF:   You are unable to keep fluids down or you get worse despite treatment.  You have frequent episodes of vomiting or diarrhea.  You have blood or green matter (bile) in your vomit.  You have blood in your stool or your stool looks black and tarry.  You have not urinated in 6 to 8 hours, or you have only urinated a small amount of very dark urine.  You have a fever.  You faint. MAKE SURE YOU:   Understand these instructions.  Will watch your condition.  Will get help right away if you are not doing well or get worse. Document Released: 01/14/2005 Document Revised: 04/08/2011 Document Reviewed: 09/03/2010 [facility] Patient Information 2014 [place], [place].  Reasons to return to MAU:  1.  Contractions are  5 minutes apart or less, each last 1 minute, these have been going on for 1-2  hours, and you cannot walk or talk during them 2.  You have a large gush of fluid, or a trickle of fluid that will not stop and you have to wear a pad 3.  You have bleeding that is bright red, heavier than spotting--like menstrual bleeding (spotting can be normal in early labor or after a check of your cervix) 4.  You do not feel the baby moving like  he/she normally does

## 2013-04-11 LAB — URINE CULTURE
Colony Count: 75000
SPECIAL REQUESTS: NORMAL

## 2013-04-13 ENCOUNTER — Inpatient Hospital Stay (HOSPITAL_COMMUNITY): Payer: Medicaid Other | Admitting: Anesthesiology

## 2013-04-13 ENCOUNTER — Encounter (HOSPITAL_COMMUNITY): Payer: Self-pay | Admitting: *Deleted

## 2013-04-13 ENCOUNTER — Inpatient Hospital Stay (HOSPITAL_COMMUNITY)
Admission: AD | Admit: 2013-04-13 | Discharge: 2013-04-15 | DRG: 775 | Disposition: A | Discharge status: Home / Self Care | Payer: Medicaid Other | Source: Ambulatory Visit | Attending: Obstetrics and Gynecology | Admitting: Obstetrics and Gynecology

## 2013-04-13 ENCOUNTER — Encounter (HOSPITAL_COMMUNITY): Payer: Medicaid Other | Admitting: Anesthesiology

## 2013-04-13 DIAGNOSIS — E669 Obesity, unspecified: Secondary | ICD-10-CM | POA: Diagnosis present

## 2013-04-13 DIAGNOSIS — O99214 Obesity complicating childbirth: Secondary | ICD-10-CM

## 2013-04-13 DIAGNOSIS — IMO0001 Reserved for inherently not codable concepts without codable children: Secondary | ICD-10-CM

## 2013-04-13 DIAGNOSIS — O139 Gestational [pregnancy-induced] hypertension without significant proteinuria, unspecified trimester: Principal | ICD-10-CM | POA: Diagnosis present

## 2013-04-13 LAB — TYPE AND SCREEN
ABO/RH(D): O POS
ANTIBODY SCREEN: NEGATIVE

## 2013-04-13 LAB — CBC
HEMATOCRIT: 34.9 % — AB (ref 36.0–46.0)
Hemoglobin: 11.3 g/dL — ABNORMAL LOW (ref 12.0–15.0)
MCH: 28.2 pg (ref 26.0–34.0)
MCHC: 32.4 g/dL (ref 30.0–36.0)
MCV: 87 fL (ref 78.0–100.0)
Platelets: 227 10*3/uL (ref 150–400)
RBC: 4.01 MIL/uL (ref 3.87–5.11)
RDW: 14.1 % (ref 11.5–15.5)
WBC: 9.3 10*3/uL (ref 4.0–10.5)

## 2013-04-13 LAB — COMPREHENSIVE METABOLIC PANEL
ALBUMIN: 2.4 g/dL — AB (ref 3.5–5.2)
ALK PHOS: 147 U/L — AB (ref 39–117)
ALT: 8 U/L (ref 0–35)
AST: 18 U/L (ref 0–37)
BUN: 9 mg/dL (ref 6–23)
CO2: 20 mEq/L (ref 19–32)
Calcium: 9.2 mg/dL (ref 8.4–10.5)
Chloride: 104 mEq/L (ref 96–112)
Creatinine, Ser: 0.67 mg/dL (ref 0.50–1.10)
GFR calc Af Amer: >90 mL/min (ref >90)
GFR calc non Af Amer: >90 mL/min (ref >90)
Glucose, Bld: 74 mg/dL (ref 70–99)
POTASSIUM: 4.4 meq/L (ref 3.7–5.3)
SODIUM: 138 meq/L (ref 137–147)
TOTAL PROTEIN: 6.4 g/dL (ref 6.0–8.3)
Total Bilirubin: 0.3 mg/dL (ref 0.3–1.2)

## 2013-04-13 LAB — RPR: RPR Ser Ql: NONREACTIVE

## 2013-04-13 LAB — ABO/RH: ABO/RH(D): O POS

## 2013-04-13 MED ORDER — DIPHENHYDRAMINE HCL 50 MG/ML IJ SOLN: 12.5000 mg | INTRAMUSCULAR | Status: DC | PRN

## 2013-04-13 MED ORDER — OXYTOCIN 40 UNITS IN LACTATED RINGERS INFUSION - SIMPLE MED
1.0000 m[IU]/min | INTRAVENOUS | Status: DC
  Administered 2013-04-13: 2 m[IU]/min via INTRAVENOUS
  Filled 2013-04-13: qty 1000

## 2013-04-13 MED ORDER — ONDANSETRON HCL 4 MG/2ML IJ SOLN: 4.0000 mg | Freq: Four times a day (QID) | INTRAMUSCULAR | Status: DC | PRN

## 2013-04-13 MED ORDER — MISOPROSTOL 200 MCG PO TABS
ORAL_TABLET | ORAL | Status: AC
  Filled 2013-04-13: qty 5

## 2013-04-13 MED ORDER — EPHEDRINE 5 MG/ML INJ
10.0000 mg | INTRAVENOUS | Status: DC | PRN
  Filled 2013-04-13: qty 2
  Filled 2013-04-13: qty 4

## 2013-04-13 MED ORDER — TERBUTALINE SULFATE 1 MG/ML IJ SOLN: 0.2500 mg | Freq: Once | INTRAMUSCULAR | Status: AC | PRN

## 2013-04-13 MED ORDER — FLEET ENEMA 7-19 GM/118ML RE ENEM: 1.0000 | ENEMA | RECTAL | Status: DC | PRN

## 2013-04-13 MED ORDER — SODIUM BICARBONATE 8.4 % IV SOLN
INTRAVENOUS | Status: DC | PRN
  Administered 2013-04-13: 5 mL via EPIDURAL

## 2013-04-13 MED ORDER — OXYCODONE-ACETAMINOPHEN 5-325 MG PO TABS: 1.0000 | ORAL_TABLET | ORAL | Status: DC | PRN

## 2013-04-13 MED ORDER — LACTATED RINGERS IV SOLN
INTRAVENOUS | Status: DC
  Administered 2013-04-13 (×2): via INTRAVENOUS

## 2013-04-13 MED ORDER — LACTATED RINGERS IV SOLN
500.0000 mL | Freq: Once | INTRAVENOUS | Status: AC
  Administered 2013-04-13: 500 mL via INTRAVENOUS

## 2013-04-13 MED ORDER — ACETAMINOPHEN 325 MG PO TABS: 650.0000 mg | ORAL_TABLET | ORAL | Status: DC | PRN

## 2013-04-13 MED ORDER — IBUPROFEN 600 MG PO TABS: 600.0000 mg | ORAL_TABLET | Freq: Four times a day (QID) | ORAL | Status: DC | PRN

## 2013-04-13 MED ORDER — FENTANYL 2.5 MCG/ML BUPIVACAINE 1/10 % EPIDURAL INFUSION (WH - ANES)
14.0000 mL/h | INTRAMUSCULAR | Status: DC | PRN
  Administered 2013-04-13: 14 mL/h via EPIDURAL
  Filled 2013-04-13: qty 125

## 2013-04-13 MED ORDER — PHENYLEPHRINE 40 MCG/ML (10ML) SYRINGE FOR IV PUSH (FOR BLOOD PRESSURE SUPPORT)
80.0000 ug | PREFILLED_SYRINGE | INTRAVENOUS | Status: DC | PRN
  Filled 2013-04-13: qty 10
  Filled 2013-04-13: qty 2

## 2013-04-13 MED ORDER — OXYTOCIN BOLUS FROM INFUSION
500.0000 mL | INTRAVENOUS | Status: DC
Start: 2013-04-13 — End: 2013-04-14
  Administered 2013-04-13: 500 mL via INTRAVENOUS

## 2013-04-13 MED ORDER — MISOPROSTOL 200 MCG PO TABS
1000.0000 ug | ORAL_TABLET | Freq: Once | ORAL | Status: AC
  Administered 2013-04-13: 1000 ug via RECTAL

## 2013-04-13 MED ORDER — LACTATED RINGERS IV SOLN: 500.0000 mL | INTRAVENOUS | Status: DC | PRN

## 2013-04-13 MED ORDER — CITRIC ACID-SODIUM CITRATE 334-500 MG/5ML PO SOLN: 30.0000 mL | ORAL | Status: DC | PRN

## 2013-04-13 MED ORDER — EPHEDRINE 5 MG/ML INJ
10.0000 mg | INTRAVENOUS | Status: DC | PRN
  Filled 2013-04-13: qty 2

## 2013-04-13 MED ORDER — LIDOCAINE HCL (PF) 1 % IJ SOLN
30.0000 mL | INTRAMUSCULAR | Status: DC | PRN
  Filled 2013-04-13: qty 30

## 2013-04-13 MED ORDER — OXYTOCIN 40 UNITS IN LACTATED RINGERS INFUSION - SIMPLE MED: 62.5000 mL/h | INTRAVENOUS | Status: DC

## 2013-04-13 MED ORDER — METHYLERGONOVINE MALEATE 0.2 MG/ML IJ SOLN
INTRAMUSCULAR | Status: AC
  Administered 2013-04-14: 0.2 mg via INTRAMUSCULAR
  Filled 2013-04-13: qty 1

## 2013-04-13 MED ORDER — PHENYLEPHRINE 40 MCG/ML (10ML) SYRINGE FOR IV PUSH (FOR BLOOD PRESSURE SUPPORT)
80.0000 ug | PREFILLED_SYRINGE | INTRAVENOUS | Status: DC | PRN
  Filled 2013-04-13: qty 2

## 2013-04-13 NOTE — Anesthesia Preprocedure Evaluation (Signed)

## 2013-04-13 NOTE — Anesthesia Procedure Notes (Signed)
Epidural Patient location during procedure: OB  Preanesthetic Checklist Completed: patient identified, site marked, surgical consent, pre-op evaluation, timeout performed, IV checked, risks and benefits discussed and monitors and equipment checked  Epidural Patient position: sitting Prep: site prepped and draped and DuraPrep Patient monitoring: continuous pulse ox and blood pressure Approach: midline Injection technique: LOR air  Needle:  Needle type: Tuohy  Needle gauge: 17 G Needle length: 9 cm and 9 Needle insertion depth: 8 cm Catheter type: closed end flexible Catheter size: 19 Gauge Catheter at skin depth: 15 cm Test dose: negative  Assessment Events: blood not aspirated, injection not painful, no injection resistance, negative IV test and no paresthesia  Additional Notes Dosing of Epidural:  1st dose, through catheter ............................................. epi 1:200K + Xylocaine 40 mg  2nd dose, through catheter, after waiting 3 minutes.....epi 1:200K + Xylocaine 60 mg    ( 2% Xylo charted as a single dose in Epic Meds for ease of charting; actual dosing was fractionated as above, for saftey's sake)  As each dose occurred, patient was free of IV sx; and patient exhibited no evidence of SA injection.  Patient is more comfortable after epidural dosed. Please see RN's note for documentation of vital signs,and FHR which are stable.  Patient reminded not to try to ambulate with numb legs, and that an RN must be present when she attempts to get up.       

## 2013-04-13 NOTE — Progress Notes (Signed)
Told Dr. Claiborne Billingsallahan she is involuntarily pushing with UCs, SVE, requesting epidural now. Will do SVE after epidural.

## 2013-04-13 NOTE — Progress Notes (Signed)
Dr. Claiborne Billingsallahan would like SVE now.

## 2013-04-13 NOTE — H&P (Signed)
28 y.o. 1249w3d  G2P1 comes in c/o for IOL GHTN at term.  Otherwise has good fetal movement and no bleeding.  NO HA/vision change or RUQ pain.   History reviewed. No pertinent past medical history. No past surgical history on file.  OB History  Gravida Para Term Preterm AB SAB TAB Ectopic Multiple Living  2 1        1     # Outcome Date GA Lbr Len/2nd Weight Sex Delivery Anes PTL Lv  2 CUR           1 PAR 09/15/07    M SVD EPI  Y      History   Social History  . Marital Status: Single    Spouse Name: N/A    Number of Children: N/A  . Years of Education: N/A   Occupational History  . Not on file.   Social History Main Topics  . Smoking status: Never Smoker   . Smokeless tobacco: Never Used  . Alcohol Use: No  . Drug Use: No  . Sexual Activity: Yes    Birth Control/ Protection: None   Other Topics Concern  . Not on file   Social History Narrative  . No narrative on file   Calamine    Prenatal Transfer Tool  Maternal Diabetes: No Genetic Screening: Declined Maternal Ultrasounds/Referrals: Normal Fetal Ultrasounds or other Referrals:  None Maternal Substance Abuse:  No Significant Maternal Medications:  None Significant Maternal Lab Results: Lab values include: Group B Strep negative  Other PNC: s>d, last US at 36.2weeks: >90% 7#15oz    Filed Vitals:   04/13/13 1631  BP: 129/84  Pulse: 69  Temp:   Resp:      Lungs/Cor:  NAD Abdomen:  soft, gravid Ex:  no cords, erythema SVE:  4/60 in office by me FHTs:  130 good STV, NST R Toco:  q 1.5-4   A/P   Admit to L&D for IOL d/t GHTN  GBS Neg  Pit 2x2  Other routine care  [place], [facility]

## 2013-04-14 ENCOUNTER — Encounter (HOSPITAL_COMMUNITY): Payer: Self-pay | Admitting: *Deleted

## 2013-04-14 LAB — CBC
HCT: 31.2 % — ABNORMAL LOW (ref 36.0–46.0)
Hemoglobin: 10.3 g/dL — ABNORMAL LOW (ref 12.0–15.0)
MCH: 28.8 pg (ref 26.0–34.0)
MCHC: 33 g/dL (ref 30.0–36.0)
MCV: 87.2 fL (ref 78.0–100.0)
Platelets: 208 10*3/uL (ref 150–400)
RBC: 3.58 MIL/uL — ABNORMAL LOW (ref 3.87–5.11)
RDW: 14 % (ref 11.5–15.5)
WBC: 15.5 10*3/uL — ABNORMAL HIGH (ref 4.0–10.5)

## 2013-04-14 MED ORDER — DIBUCAINE 1 % RE OINT: 1.0000 "application " | TOPICAL_OINTMENT | RECTAL | Status: DC | PRN

## 2013-04-14 MED ORDER — WITCH HAZEL-GLYCERIN EX PADS: 1.0000 "application " | MEDICATED_PAD | CUTANEOUS | Status: DC | PRN

## 2013-04-14 MED ORDER — METHYLERGONOVINE MALEATE 0.2 MG/ML IJ SOLN: 0.2000 mg | Freq: Once | INTRAMUSCULAR | Status: DC

## 2013-04-14 MED ORDER — DIPHENHYDRAMINE HCL 25 MG PO CAPS: 25.0000 mg | ORAL_CAPSULE | Freq: Four times a day (QID) | ORAL | Status: DC | PRN

## 2013-04-14 MED ORDER — SENNOSIDES-DOCUSATE SODIUM 8.6-50 MG PO TABS
2.0000 | ORAL_TABLET | ORAL | Status: DC
  Administered 2013-04-15: 2 via ORAL
  Filled 2013-04-14: qty 2

## 2013-04-14 MED ORDER — LANOLIN HYDROUS EX OINT: TOPICAL_OINTMENT | CUTANEOUS | Status: DC | PRN

## 2013-04-14 MED ORDER — BENZOCAINE-MENTHOL 20-0.5 % EX AERO
1.0000 "application " | INHALATION_SPRAY | CUTANEOUS | Status: DC | PRN
  Administered 2013-04-15: 1 via TOPICAL
  Filled 2013-04-14: qty 56

## 2013-04-14 MED ORDER — IBUPROFEN 600 MG PO TABS
600.0000 mg | ORAL_TABLET | Freq: Four times a day (QID) | ORAL | Status: DC
  Administered 2013-04-14 – 2013-04-15 (×6): 600 mg via ORAL
  Filled 2013-04-14 (×6): qty 1

## 2013-04-14 MED ORDER — ONDANSETRON HCL 4 MG/2ML IJ SOLN: 4.0000 mg | INTRAMUSCULAR | Status: DC | PRN

## 2013-04-14 MED ORDER — PRENATAL MULTIVITAMIN CH
1.0000 | ORAL_TABLET | Freq: Every day | ORAL | Status: DC
  Administered 2013-04-14 – 2013-04-15 (×2): 1 via ORAL
  Filled 2013-04-14 (×2): qty 1

## 2013-04-14 MED ORDER — SIMETHICONE 80 MG PO CHEW: 80.0000 mg | CHEWABLE_TABLET | ORAL | Status: DC | PRN

## 2013-04-14 MED ORDER — OXYCODONE-ACETAMINOPHEN 5-325 MG PO TABS
1.0000 | ORAL_TABLET | ORAL | Status: DC | PRN
  Administered 2013-04-15: 1 via ORAL
  Filled 2013-04-14: qty 1

## 2013-04-14 MED ORDER — TETANUS-DIPHTH-ACELL PERTUSSIS 5-2.5-18.5 LF-MCG/0.5 IM SUSP: 0.5000 mL | Freq: Once | INTRAMUSCULAR | Status: DC

## 2013-04-14 MED ORDER — ONDANSETRON HCL 4 MG PO TABS: 4.0000 mg | ORAL_TABLET | ORAL | Status: DC | PRN

## 2013-04-14 MED ORDER — ZOLPIDEM TARTRATE 5 MG PO TABS: 5.0000 mg | ORAL_TABLET | Freq: Every evening | ORAL | Status: DC | PRN

## 2013-04-14 NOTE — Progress Notes (Signed)
Patient is eating, ambulating, voiding.  Pain control is good.  Filed Vitals:   04/14/13 0128 04/14/13 0148 04/14/13 0250 04/14/13 0650  BP: 126/79 130/84 133/87 124/83  Pulse: 95 75 88 77  Temp:  98.2 F (36.8 C) 97.9 F (36.6 C) 98.2 F (36.8 C)  TempSrc:  Oral Oral Oral  Resp: 18 18 18 18   Height:      Weight:      SpO2:  97% 96% 96%    Fundus firm Perineum without swelling.  Lab Results  Component Value Date   WBC 15.5* 04/14/2013   HGB 10.3* 04/14/2013   HCT 31.2* 04/14/2013   MCV 87.2 04/14/2013   PLT 208 04/14/2013    --/--/O POS, O POS (03/17 1200)/RI  A/P Post partum day 0.  Routine care.  Expect d/c routine.    Caroline Taylor A

## 2013-04-14 NOTE — Anesthesia Postprocedure Evaluation (Signed)
Anesthesia Post Note  Patient: Caroline Taylor  Procedure(s) Performed: * No procedures listed *  Anesthesia type: Epidural  Patient location: Mother/Baby  Post pain: Pain level controlled  Post assessment: Post-op Vital signs reviewed  Last Vitals:  Filed Vitals:   04/14/13 1515  BP: 133/89  Pulse: 81  Temp: 36.6 C  Resp: 18    Post vital signs: Reviewed  Level of consciousness:alert  Complications: No apparent anesthesia complications

## 2013-04-14 NOTE — Progress Notes (Signed)
UR chart review completed.  

## 2013-04-15 NOTE — Discharge Instructions (Signed)

## 2013-04-15 NOTE — Discharge Summary (Signed)
Obstetric Discharge Summary Reason for Admission: induction of labor Prenatal Procedures: none Intrapartum Procedures: spontaneous vaginal delivery Postpartum Procedures: none Complications-Operative and Postpartum: none Hemoglobin  Date Value Ref Range Status  04/14/2013 10.3* 12.0 - 15.0 g/dL Final     HCT  Date Value Ref Range Status  04/14/2013 31.2* 36.0 - 46.0 % Final    Physical Exam:  General: alert, cooperative and no distress  Lochia: appropriate  Uterine Fundus: firm  Perineum: healing well  DVT Evaluation: No evidence of DVT seen on physical exam.   Discharge Diagnoses: Term Pregnancy-delivered  Discharge Information: Date: 04/15/2013 Activity: pelvic rest Diet: routine Medications: PNV and Ibuprofen Condition: stable Instructions: refer to practice specific booklet Discharge to: home   Newborn Data: Live born female  Birth Weight: 10 lb 2 oz (4593 g) APGAR: 9, 9  Home with mother.  Essie HartINN, Eleesha Purkey STACIA 04/15/2013, 11:26 AM

## 2013-04-15 NOTE — Progress Notes (Signed)
Post Partum Day 1 Subjective: no complaints, up ad lib, voiding, tolerating PO and + flatus. Mother is bottle feeding, mother and baby bonding well.   Objective: Blood pressure 124/84, pulse 80, temperature 98.3 F (36.8 C), temperature source Oral, resp. rate 18, height 5' 6.5" (1.689 m), weight 121.564 kg (268 lb), last menstrual period 07/18/2012, SpO2 96.00%, unknown if currently breastfeeding.  Physical Exam:  General: alert, cooperative and no distress Lochia: appropriate Uterine Fundus: firm Perineum: healing well DVT Evaluation: No evidence of DVT seen on physical exam.   Recent Labs  04/13/13 1200 04/14/13 0640  HGB 11.3* 10.3*  HCT 34.9* 31.2*    Assessment/Plan: Circumcision prior to discharge Routine post partum care Possible discharge home today   LOS: 2 days   Caroline Taylor STACIA 04/15/2013, 8:52 AM

## 2013-06-27 ENCOUNTER — Inpatient Hospital Stay (HOSPITAL_COMMUNITY)
Admission: AD | Admit: 2013-06-27 | Discharge: 2013-06-27 | Disposition: A | Discharge status: Home / Self Care | Payer: Medicaid Other | Source: Ambulatory Visit | Attending: Obstetrics and Gynecology | Admitting: Obstetrics and Gynecology

## 2013-06-27 ENCOUNTER — Encounter (HOSPITAL_COMMUNITY): Payer: Self-pay | Admitting: *Deleted

## 2013-06-27 DIAGNOSIS — N390 Urinary tract infection, site not specified: Secondary | ICD-10-CM | POA: Insufficient documentation

## 2013-06-27 DIAGNOSIS — N760 Acute vaginitis: Secondary | ICD-10-CM | POA: Insufficient documentation

## 2013-06-27 DIAGNOSIS — B9689 Other specified bacterial agents as the cause of diseases classified elsewhere: Secondary | ICD-10-CM | POA: Insufficient documentation

## 2013-06-27 DIAGNOSIS — A499 Bacterial infection, unspecified: Secondary | ICD-10-CM | POA: Insufficient documentation

## 2013-06-27 LAB — URINALYSIS, ROUTINE W REFLEX MICROSCOPIC
Bilirubin Urine: NEGATIVE
GLUCOSE, UA: NEGATIVE mg/dL
KETONES UR: NEGATIVE mg/dL
Nitrite: NEGATIVE
Protein, ur: 100 mg/dL — AB
Urobilinogen, UA: 0.2 mg/dL (ref 0.0–1.0)
pH: 6 (ref 5.0–8.0)

## 2013-06-27 LAB — URINE MICROSCOPIC-ADD ON

## 2013-06-27 LAB — WET PREP, GENITAL
Trich, Wet Prep: NONE SEEN
YEAST WET PREP: NONE SEEN

## 2013-06-27 LAB — POCT PREGNANCY, URINE: PREG TEST UR: NEGATIVE

## 2013-06-27 MED ORDER — METRONIDAZOLE 500 MG PO TABS: 500.0000 mg | ORAL_TABLET | Freq: Two times a day (BID) | ORAL | Status: DC

## 2013-06-27 MED ORDER — FLUCONAZOLE 150 MG PO TABS: 150.0000 mg | ORAL_TABLET | Freq: Every day | ORAL | Status: DC

## 2013-06-27 MED ORDER — CIPROFLOXACIN HCL 500 MG PO TABS: 500.0000 mg | ORAL_TABLET | Freq: Two times a day (BID) | ORAL | Status: DC

## 2013-06-27 NOTE — Discharge Instructions (Signed)
Urinary Tract Infection °Urinary tract infections (UTIs) can develop anywhere along your urinary tract. Your urinary tract is your body's drainage system for removing wastes and extra water. Your urinary tract includes two kidneys, two ureters, a bladder, and a urethra. Your kidneys are a pair of bean-shaped organs. Each kidney is about the size of your fist. They are located below your ribs, one on each side of your spine. °CAUSES °Infections are caused by microbes, which are microscopic organisms, including fungi, viruses, and bacteria. These organisms are so small that they can only be seen through a microscope. Bacteria are the microbes that most commonly cause UTIs. °SYMPTOMS  °Symptoms of UTIs may vary by age and gender of the patient and by the location of the infection. Symptoms in young women typically include a frequent and intense urge to urinate and a painful, burning feeling in the bladder or urethra during urination. Older women and men are more likely to be tired, shaky, and weak and have muscle aches and abdominal pain. A fever may mean the infection is in your kidneys. Other symptoms of a kidney infection include pain in your back or sides below the ribs, nausea, and vomiting. °DIAGNOSIS °To diagnose a UTI, your caregiver will ask you about your symptoms. Your caregiver also will ask to provide a urine sample. The urine sample will be tested for bacteria and white blood cells. White blood cells are made by your body to help fight infection. °TREATMENT  °Typically, UTIs can be treated with medication. Because most UTIs are caused by a bacterial infection, they usually can be treated with the use of antibiotics. The choice of antibiotic and length of treatment depend on your symptoms and the type of bacteria causing your infection. °HOME CARE INSTRUCTIONS °· If you were prescribed antibiotics, take them exactly as your caregiver instructs you. Finish the medication even if you feel better after you  have only taken some of the medication. °· Drink enough water and fluids to keep your urine clear or pale yellow. °· Avoid caffeine, tea, and carbonated beverages. They tend to irritate your bladder. °· Empty your bladder often. Avoid holding urine for long periods of time. °· Empty your bladder before and after sexual intercourse. °· After a bowel movement, women should cleanse from front to back. Use each tissue only once. °SEEK MEDICAL CARE IF:  °· You have back pain. °· You develop a fever. °· Your symptoms do not begin to resolve within 3 days. °SEEK IMMEDIATE MEDICAL CARE IF:  °· You have severe back pain or lower abdominal pain. °· You develop chills. °· You have nausea or vomiting. °· You have continued burning or discomfort with urination. °MAKE SURE YOU:  °· Understand these instructions. °· Will watch your condition. °· Will get help right away if you are not doing well or get worse. °Document Released: 10/24/2004 Document Revised: 07/16/2011 Document Reviewed: 02/22/2011 °ExitCare® Patient Information ©2014 [employer]. ° °Bacterial Vaginosis °Bacterial vaginosis is a vaginal infection that occurs when the normal balance of bacteria in the vagina is disrupted. It results from an overgrowth of certain bacteria. This is the most common vaginal infection in women of childbearing age. Treatment is important to prevent complications, especially in pregnant women, as it can cause a premature delivery. °CAUSES  °Bacterial vaginosis is caused by an increase in harmful bacteria that are normally present in smaller amounts in the vagina. Several different kinds of bacteria can cause bacterial vaginosis. However, the reason that the condition   develops is not fully understood. °RISK FACTORS °Certain activities or behaviors can put you at an increased risk of developing bacterial vaginosis, including: °· Having a new sex partner or multiple sex partners. °· Douching. °· Using an intrauterine device (IUD) for  contraception. °Women do not get bacterial vaginosis from toilet seats, bedding, swimming pools, or contact with objects around them. °SIGNS AND SYMPTOMS  °Some women with bacterial vaginosis have no signs or symptoms. Common symptoms include: °· Grey vaginal discharge. °· A fishlike odor with discharge, especially after sexual intercourse. °· Itching or burning of the vagina and vulva. °· Burning or pain with urination. °DIAGNOSIS  °Your health care provider will take a medical history and examine the vagina for signs of bacterial vaginosis. A sample of vaginal fluid may be taken. Your health care provider will look at this sample under a microscope to check for bacteria and abnormal cells. A vaginal pH test may also be done.  °TREATMENT  °Bacterial vaginosis may be treated with antibiotic medicines. These may be given in the form of a pill or a vaginal cream. A second round of antibiotics may be prescribed if the condition comes back after treatment.  °HOME CARE INSTRUCTIONS  °· Only take over-the-counter or prescription medicines as directed by your health care provider. °· If antibiotic medicine was prescribed, take it as directed. Make sure you finish it even if you start to feel better. °· Do not have sex until treatment is completed. °· Tell all sexual partners that you have a vaginal infection. They should see their health care provider and be treated if they have problems, such as a mild rash or itching. °· Practice safe sex by using condoms and only having one sex partner. °SEEK MEDICAL CARE IF:  °· Your symptoms are not improving after 3 days of treatment. °· You have increased discharge or pain. °· You have a fever. °MAKE SURE YOU:  °· Understand these instructions. °· Will watch your condition. °· Will get help right away if you are not doing well or get worse. °FOR MORE INFORMATION  °Centers for Disease Control and Prevention, Division of STD Prevention: www.cdc.gov/std °American Sexual Health  Association (ASHA): www.ashastd.org  °Document Released: 01/14/2005 Document Revised: 11/04/2012 Document Reviewed: 08/26/2012 °ExitCare® Patient Information ©2014 [employer]. ° °

## 2013-06-27 NOTE — MAU Note (Signed)
Patient presents with complaint of UTI s/s since Wednesday.

## 2013-06-27 NOTE — MAU Provider Note (Signed)
History     CSN: [id number]  Arrival date and time: 06/27/13 0901   First Provider Initiated Contact with Patient 06/27/13 [phone]      Chief Complaint  Patient presents with  . Urinary Tract Infection   HPI Comments: Roseleen Yarn 28 y.o. G2P1002 presents to MAU with symptoms of UTI ongoing since wednesday.She has been drinking cranberry juice and taking AZO with some help. Denies any fever, chills or new partner. Delivered vaginally 04/15/13. Pt has Nexplanon for birth control  Urinary Tract Infection  Associated symptoms include frequency and urgency.      History reviewed. No pertinent past medical history.  Past Surgical History  Procedure Laterality Date  . No past surgeries      Family History  Problem Relation Age of Onset  . Diabetes Brother   . Heart failure Other     History  Substance Use Topics  . Smoking status: Never Smoker   . Smokeless tobacco: Never Used  . Alcohol Use: No    Allergies:  Allergies  Allergen Reactions  . Calamine Rash    Prescriptions prior to admission  Medication Sig Dispense Refill  . ibuprofen (ADVIL,MOTRIN) 200 MG tablet Take 600 mg by mouth every 6 (six) hours as needed for moderate pain.        Review of Systems  Constitutional: Negative.   HENT: Negative.   Respiratory: Negative.   Cardiovascular: Negative.   Gastrointestinal: Negative.   Genitourinary: Positive for dysuria, urgency and frequency.  Musculoskeletal: Negative.   Skin: Negative.   Neurological: Negative.   Psychiatric/Behavioral: Negative.    Physical Exam   Blood pressure 108/68, pulse 75, temperature 98.1 F (36.7 C), temperature source Oral, resp. rate 18, height 5\' 8"  (1.727 m), weight 103.42 kg (228 lb), last menstrual period 05/31/2013, unknown if currently breastfeeding.  Physical Exam  Constitutional: She is oriented to person, place, and time. She appears well-developed and well-nourished. No distress.  HENT:  Head: Normocephalic and  atraumatic.  Eyes: Pupils are equal, round, and reactive to light.  GI: Soft. She exhibits no distension. There is no tenderness. There is no rebound and no guarding.  Genitourinary:  Genital:External negative Vaginal:small amount vaginal discharge Cervix:closed/ no lesions Bimanual:tender   Musculoskeletal: Normal range of motion.  Neurological: She is alert and oriented to person, place, and time.  Skin: Skin is warm and dry.  Psychiatric: She has a normal mood and affect. Her behavior is normal. Judgment and thought content normal.   Results for orders placed during the hospital encounter of 06/27/13 (from the past 24 hour(s))  URINALYSIS, ROUTINE W REFLEX MICROSCOPIC     Status: Abnormal   Collection Time    06/27/13  9:17 AM      Result Value Ref Range   Color, Urine YELLOW  YELLOW   APPearance CLOUDY (*) CLEAR   Specific Gravity, Urine >1.030 (*) 1.005 - 1.030   pH 6.0  5.0 - 8.0   Glucose, UA NEGATIVE  NEGATIVE mg/dL   Hgb urine dipstick LARGE (*) NEGATIVE   Bilirubin Urine NEGATIVE  NEGATIVE   Ketones, ur NEGATIVE  NEGATIVE mg/dL   Protein, ur 474 (*) NEGATIVE mg/dL   Urobilinogen, UA 0.2  0.0 - 1.0 mg/dL   Nitrite NEGATIVE  NEGATIVE   Leukocytes, UA SMALL (*) NEGATIVE  URINE MICROSCOPIC-ADD ON     Status: Abnormal   Collection Time    06/27/13  9:17 AM      Result Value Ref Range   Squamous  Epithelial / LPF FEW (*) RARE   WBC, UA TOO NUMEROUS TO COUNT  <3 WBC/hpf   RBC / HPF TOO NUMEROUS TO COUNT  <3 RBC/hpf   Bacteria, UA RARE  RARE   Urine-Other MUCOUS PRESENT    POCT PREGNANCY, URINE     Status: None   Collection Time    06/27/13  9:23 AM      Result Value Ref Range   Preg Test, Ur NEGATIVE  NEGATIVE   Results for orders placed during the hospital encounter of 06/27/13 (from the past 24 hour(s))  URINALYSIS, ROUTINE W REFLEX MICROSCOPIC     Status: Abnormal   Collection Time    06/27/13  9:17 AM      Result Value Ref Range   Color, Urine YELLOW  YELLOW    APPearance CLOUDY (*) CLEAR   Specific Gravity, Urine >1.030 (*) 1.005 - 1.030   pH 6.0  5.0 - 8.0   Glucose, UA NEGATIVE  NEGATIVE mg/dL   Hgb urine dipstick LARGE (*) NEGATIVE   Bilirubin Urine NEGATIVE  NEGATIVE   Ketones, ur NEGATIVE  NEGATIVE mg/dL   Protein, ur 952100 (*) NEGATIVE mg/dL   Urobilinogen, UA 0.2  0.0 - 1.0 mg/dL   Nitrite NEGATIVE  NEGATIVE   Leukocytes, UA SMALL (*) NEGATIVE  URINE MICROSCOPIC-ADD ON     Status: Abnormal   Collection Time    06/27/13  9:17 AM      Result Value Ref Range   Squamous Epithelial / LPF FEW (*) RARE   WBC, UA TOO NUMEROUS TO COUNT  <3 WBC/hpf   RBC / HPF TOO NUMEROUS TO COUNT  <3 RBC/hpf   Bacteria, UA RARE  RARE   Urine-Other MUCOUS PRESENT    POCT PREGNANCY, URINE     Status: None   Collection Time    06/27/13  9:23 AM      Result Value Ref Range   Preg Test, Ur NEGATIVE  NEGATIVE  WET PREP, GENITAL     Status: Abnormal   Collection Time    06/27/13  9:57 AM      Result Value Ref Range   Yeast Wet Prep HPF POC NONE SEEN  NONE SEEN   Trich, Wet Prep NONE SEEN  NONE SEEN   Clue Cells Wet Prep HPF POC MODERATE (*) NONE SEEN   WBC, Wet Prep HPF POC MANY (*) NONE SEEN     MAU Course  Procedures  MDM   Assessment and Plan   A: UIT Bacterial Vaginosis  P: Cipro 500 mg po BID x 5days Flagyl 500 mg BID x 7 days after Cipro Diflucan 150 mg after antibotics Force fluids/ OTC pain meds/ rest  Doralee AlbinoLinda M Daylin Gruszka 06/27/2013, 10:04 AM

## 2013-06-28 LAB — GC/CHLAMYDIA PROBE AMP
CT PROBE, AMP APTIMA: NEGATIVE
GC PROBE AMP APTIMA: NEGATIVE

## 2013-06-29 LAB — URINE CULTURE: Special Requests: NORMAL

## 2013-11-29 ENCOUNTER — Encounter (HOSPITAL_COMMUNITY): Payer: Self-pay | Admitting: *Deleted

## 2014-09-17 ENCOUNTER — Inpatient Hospital Stay (HOSPITAL_COMMUNITY)
Admission: AD | Admit: 2014-09-17 | Discharge: 2014-09-17 | Disposition: A | Discharge status: Home / Self Care | Payer: Medicaid Other | Source: Ambulatory Visit | Attending: Obstetrics and Gynecology | Admitting: Obstetrics and Gynecology

## 2014-09-17 ENCOUNTER — Encounter (HOSPITAL_COMMUNITY): Payer: Self-pay

## 2014-09-17 DIAGNOSIS — N939 Abnormal uterine and vaginal bleeding, unspecified: Secondary | ICD-10-CM

## 2014-09-17 HISTORY — DX: Anxiety disorder, unspecified: F41.9

## 2014-09-17 HISTORY — DX: Anemia, unspecified: D64.9

## 2014-09-17 HISTORY — DX: Depression, unspecified: F32.A

## 2014-09-17 HISTORY — DX: Major depressive disorder, single episode, unspecified: F32.9

## 2014-09-17 HISTORY — DX: Gestational (pregnancy-induced) hypertension without significant proteinuria, unspecified trimester: O13.9

## 2014-09-17 LAB — WET PREP, GENITAL
Clue Cells Wet Prep HPF POC: NONE SEEN
Trich, Wet Prep: NONE SEEN
Yeast Wet Prep HPF POC: NONE SEEN

## 2014-09-17 LAB — POCT PREGNANCY, URINE: PREG TEST UR: NEGATIVE

## 2014-09-17 MED ORDER — NORGESTIMATE-ETH ESTRADIOL 0.25-35 MG-MCG PO TABS: 1.0000 | ORAL_TABLET | Freq: Every day | ORAL | Status: AC

## 2014-09-17 NOTE — MAU Provider Note (Signed)
History     CSN: [id number]  Arrival date and time: 09/17/14 1708   First Provider Initiated Contact with Patient 09/17/14 1806      Chief Complaint  Patient presents with  . Vaginal Bleeding   HPI Caroline Taylor 29 y.o.  Comes to MAU with heavier bleeding with Nexplanon.  Has had the implant for about a year.  Has always had some bleeding with it and for the past month it has been worse.  Bleeds every 2 weeks for 7 days.  Sometimes is is Barthold spotting for 7 days and sometimes it is red blood for 7 days.  Bled on 09-05-14 with red blood.  Stopped on 09-12-14 and then started bleeding again and it was heavier on 09-16-14.  Small amount of abdominal pain - 2/10.  This bleeding is worse than usual with quarter sized clots.  She usually does not have clots.  Is taking excedrin as she needs to have her wisdom teeth removed and she has been having headaches from it.    OB History    Gravida Para Term Preterm AB TAB SAB Ectopic Multiple Living   Past Medical History  Diagnosis Date  . Anemia   . Pregnancy induced hypertension   . Anxiety   . Depression     Past Surgical History  Procedure Laterality Date  . No past surgeries      Family History  Problem Relation Age of Onset  . Diabetes Brother   . Heart failure Other     Social History  Substance Use Topics  . Smoking status: Never Smoker   . Smokeless tobacco: Never Used  . Alcohol Use: No    Allergies:  Allergies  Allergen Reactions  . Calamine Rash    Prescriptions prior to admission  Medication Sig Dispense Refill Last Dose  . ciprofloxacin (CIPRO) 500 MG tablet Take 1 tablet (500 mg total) by mouth 2 (two) times daily. 10 tablet 0   . fluconazole (DIFLUCAN) 150 MG tablet Take 1 tablet (150 mg total) by mouth daily. 1 tablet 1   . ibuprofen (ADVIL,MOTRIN) 200 MG tablet Take 600 mg by mouth every 6 (six) hours as needed for moderate pain.   06/27/2013 at Unknown time  . metroNIDAZOLE (FLAGYL) 500 MG  tablet Take 1 tablet (500 mg total) by mouth 2 (two) times daily. 14 tablet 0     Review of Systems  Constitutional: Negative for fever.  Gastrointestinal: Positive for abdominal pain. Negative for nausea, vomiting, diarrhea and constipation.  Genitourinary:       No vaginal discharge. Vaginal bleeding. No dysuria.   Physical Exam   Blood pressure 132/89, pulse 89, temperature 98.6 F (37 C), temperature source Oral, resp. rate 18, height  (1.727 m), weight 207 lb 6 oz (94.065 kg), unknown if currently breastfeeding.  Physical Exam  Nursing note and vitals reviewed. Constitutional: She is oriented to person, place, and time. She appears well-developed and well-nourished.  HENT:  Head: Normocephalic.  Eyes: EOM are normal.  Neck: Neck supple.  GI: Soft. There is no tenderness.  Genitourinary:  Speculum exam: Vagina - moderate amount of blood, no odor, no clots Cervix - Small amount of active bleeding Bimanual exam: Cervix closed Uterus non tender, normal size Adnexa non tender, no masses bilaterally GC/Chlam, wet prep done Chaperone present for exam.  Musculoskeletal: Normal range of motion.  Nexplanon palpated in inner left  arm.  Neurological: She is alert and oriented to person, place, and time.  Skin: Skin is warm and dry.  Psychiatric: She has a normal mood and affect.    MAU Course  Procedures  MDM   Assessment and Plan  Abnormal vaginal bleeding Nexplanon in place  Plan Cultures pending to rule out infection as a cause for bleeding Will prescribe one pack of birth control pills to help control her bleeding. Will need to follow up in the office for further management. Client is agreeable to this plan. Discussed she may need to stop the excedrin and take ibuprofen instead as excedrin contains aspirin and may contribute to making her bleeding worse.  Cristina Mattern 09/17/2014, 6:29 PM

## 2014-09-17 NOTE — Discharge Instructions (Signed)
follow up in the office for further management. May want to stop excedrin and take Ibuprofen 600 mg by mouth every 6 hours as needed for pain to see if the bleeding is less when you use ibuprofen instead of excedrin.  Take with a couple of bites of food. Get your pills at the pharmacy and start taking one by mouth daily - start today.

## 2014-09-17 NOTE — MAU Note (Signed)
Implanon x1 +years. Had menstrual cycle 09/05/2014 lasting 5 days, then began bleeding again yesterday passing clots now quarter sized. Mild pain at present.

## 2014-09-19 LAB — GC/CHLAMYDIA PROBE AMP (~~LOC~~) NOT AT ARMC
Chlamydia: NEGATIVE
Neisseria Gonorrhea: NEGATIVE

## 2015-04-12 ENCOUNTER — Encounter (HOSPITAL_COMMUNITY): Payer: Self-pay | Admitting: Family Medicine

## 2015-04-12 ENCOUNTER — Emergency Department (HOSPITAL_COMMUNITY)
Admission: EM | Admit: 2015-04-12 | Discharge: 2015-04-12 | Disposition: A | Discharge status: Home / Self Care | Payer: Self-pay | Attending: Emergency Medicine | Admitting: Emergency Medicine

## 2015-04-12 DIAGNOSIS — Z8659 Personal history of other mental and behavioral disorders: Secondary | ICD-10-CM | POA: Insufficient documentation

## 2015-04-12 DIAGNOSIS — J020 Streptococcal pharyngitis: Secondary | ICD-10-CM | POA: Insufficient documentation

## 2015-04-12 DIAGNOSIS — Z79818 Long term (current) use of other agents affecting estrogen receptors and estrogen levels: Secondary | ICD-10-CM | POA: Insufficient documentation

## 2015-04-12 DIAGNOSIS — Z862 Personal history of diseases of the blood and blood-forming organs and certain disorders involving the immune mechanism: Secondary | ICD-10-CM | POA: Insufficient documentation

## 2015-04-12 LAB — RAPID STREP SCREEN (MED CTR MEBANE ONLY): Streptococcus, Group A Screen (Direct): POSITIVE — AB

## 2015-04-12 MED ORDER — IBUPROFEN 100 MG/5ML PO SUSP
600.0000 mg | Freq: Once | ORAL | Status: AC
  Administered 2015-04-12: 600 mg via ORAL
  Filled 2015-04-12: qty 30

## 2015-04-12 MED ORDER — DEXAMETHASONE 4 MG PO TABS
10.0000 mg | ORAL_TABLET | Freq: Once | ORAL | Status: AC
  Administered 2015-04-12: 10 mg via ORAL
  Filled 2015-04-12: qty 3

## 2015-04-12 MED ORDER — PENICILLIN G BENZATHINE 1200000 UNIT/2ML IM SUSP
1.2000 10*6.[IU] | Freq: Once | INTRAMUSCULAR | Status: AC
  Administered 2015-04-12: 1.2 10*6.[IU] via INTRAMUSCULAR
  Filled 2015-04-12: qty 2

## 2015-04-12 MED ORDER — HYDROCODONE-ACETAMINOPHEN 7.5-325 MG/15ML PO SOLN: 10.0000 mL | Freq: Four times a day (QID) | ORAL | Status: AC | PRN

## 2015-04-12 NOTE — ED Notes (Signed)
Declined W/C at D/C and was escorted to lobby by RN. 

## 2015-04-12 NOTE — Discharge Instructions (Signed)
Pharyngitis °Pharyngitis is a sore throat (pharynx). There is redness, pain, and swelling of your throat. °HOME CARE  °· Drink enough fluids to keep your pee (urine) clear or pale yellow. °· Only take medicine as told by your doctor. °¨ You may get sick again if you do not take medicine as told. Finish your medicines, even if you start to feel better. °¨ Do not take aspirin. °· Rest. °· Rinse your mouth (gargle) with salt water (½ tsp of salt per 1 qt of water) every 1-2 hours. This will help the pain. °· If you are not at risk for choking, you can suck on hard candy or sore throat lozenges. °GET HELP IF: °· You have large, tender lumps on your neck. °· You have a rash. °· You cough up green, yellow-Favorite, or bloody spit. °GET HELP RIGHT AWAY IF:  °· You have a stiff neck. °· You drool or cannot swallow liquids. °· You throw up (vomit) or are not able to keep medicine or liquids down. °· You have very bad pain that does not go away with medicine. °· You have problems breathing (not from a stuffy nose). °MAKE SURE YOU:  °· Understand these instructions. °· Will watch your condition. °· Will get help right away if you are not doing well or get worse. °  °This information is not intended to replace advice given to you by your health care provider. Make sure you discuss any questions you have with your health care provider. °  °Document Released: 07/03/2007 Document Revised: 11/04/2012 Document Reviewed: 09/21/2012 °Elsevier Interactive Patient Education ©2016 [employer]. ° °

## 2015-04-12 NOTE — ED Provider Notes (Addendum)
CSN: [id number]     Arrival date & time 04/12/15  [id number] History   First MD Initiated Contact with Patient 04/12/15 [phone]     Chief Complaint  Patient presents with  . Sore Throat     (Consider location/radiation/quality/duration/timing/severity/associated sxs/prior Treatment) Patient is a 30 y.o. female presenting with pharyngitis. The history is provided by the patient.  Sore Throat This is a new problem. The current episode started yesterday. The problem occurs constantly. The problem has been gradually worsening. Associated symptoms comments: Fever, chills, painful swallowing.  No cough, v/d.Marland Kitchen. The symptoms are aggravated by swallowing. Nothing relieves the symptoms. She has tried acetaminophen for the symptoms. The treatment provided no relief.    Past Medical History  Diagnosis Date  . Anemia   . Pregnancy induced hypertension   . Anxiety   . Depression    Past Surgical History  Procedure Laterality Date  . No past surgeries     Family History  Problem Relation Age of Onset  . Diabetes Brother   . Heart failure Other    Social History  Substance Use Topics  . Smoking status: Never Smoker   . Smokeless tobacco: Never Used  . Alcohol Use: No   OB History    Gravida Para Term Preterm AB TAB SAB Ectopic Multiple Living   2 2 1       2      Review of Systems  All other systems reviewed and are negative.     Allergies  Calamine  Home Medications   Prior to Admission medications   Medication Sig Start Date End Date Taking? Authorizing Provider  ibuprofen (ADVIL,MOTRIN) 200 MG tablet Take 600 mg by mouth every 6 (six) hours as needed for moderate pain.    Historical Provider, MD  norgestimate-ethinyl estradiol (ORTHO-CYCLEN,SPRINTEC,PREVIFEM) 0.25-35 MG-MCG tablet Take 1 tablet by mouth daily. 09/17/14   Currie Pariserri L Burleson, NP   BP 120/73 mmHg  Pulse 105  Temp(Src) 98.5 F (36.9 C)  Resp 18  SpO2 98%  LMP 04/12/2015 Physical Exam  Constitutional: She is  oriented to person, place, and time. She appears well-developed and well-nourished. No distress.  HENT:  Head: Normocephalic and atraumatic.  Right Ear: Tympanic membrane normal.  Left Ear: Tympanic membrane normal.  Mouth/Throat: Posterior oropharyngeal erythema present.    Eyes: Conjunctivae and EOM are normal. Pupils are equal, round, and reactive to light.  Neck: Normal range of motion. Neck supple.  Cardiovascular: Normal rate, regular rhythm and intact distal pulses.   No murmur heard. Pulmonary/Chest: Effort normal and breath sounds normal. No respiratory distress. She has no wheezes. She has no rales.  Musculoskeletal: Normal range of motion. She exhibits no edema or tenderness.  Neurological: She is alert and oriented to person, place, and time.  Skin: Skin is warm and dry. No rash noted. No erythema.  Psychiatric: She has a normal mood and affect. Her behavior is normal.  Nursing note and vitals reviewed.   ED Course  Procedures (including critical care time) Labs Review Labs Reviewed  RAPID STREP SCREEN (NOT [facility]) - Abnormal; Notable for the following:    Streptococcus, Group A Screen (Direct) POSITIVE (*)    All other components within normal limits    Imaging Review No results found. I have personally reviewed and evaluated these images and lab results as part of my medical decision-making.   EKG Interpretation None      MDM   Final diagnoses:  Strep pharyngitis    Patient is  a healthy 30 year old female presenting today with 2 days of worsening sore throat, fever and chills at home. She denies cough, shortness of breath, vomiting or diarrhea. She has no significant medical issues.  On exam patient has cervical lymphadenopathy as well as ulcers lining the palate with petechiae and erythema of bilateral tonsils. No evidence for concern for epiglottitis, RPA, PTA.  Strep screen was positive. Patient given IM penicillin and Decadron.    Gwyneth Sprout,  MD 04/12/15 1610  Gwyneth Sprout, MD 04/12/15 [phone]

## 2015-04-12 NOTE — ED Notes (Signed)
Pt here with sore throat and chills. sts she has been around someone that is sick.

## 2016-11-09 ENCOUNTER — Emergency Department (HOSPITAL_COMMUNITY): Payer: Self-pay

## 2016-11-09 ENCOUNTER — Emergency Department (HOSPITAL_BASED_OUTPATIENT_CLINIC_OR_DEPARTMENT_OTHER)
Admission: EM | Admit: 2016-11-09 | Discharge: 2016-11-09 | Disposition: A | Discharge status: Home / Self Care | Payer: Self-pay | Source: Home / Self Care

## 2016-11-09 ENCOUNTER — Encounter (HOSPITAL_COMMUNITY): Payer: Self-pay | Admitting: Emergency Medicine

## 2016-11-09 ENCOUNTER — Emergency Department (HOSPITAL_COMMUNITY)
Admission: EM | Admit: 2016-11-09 | Discharge: 2016-11-09 | Disposition: A | Discharge status: Home / Self Care | Payer: Self-pay | Attending: Emergency Medicine | Admitting: Emergency Medicine

## 2016-11-09 DIAGNOSIS — M79609 Pain in unspecified limb: Secondary | ICD-10-CM

## 2016-11-09 DIAGNOSIS — M79661 Pain in right lower leg: Secondary | ICD-10-CM | POA: Insufficient documentation

## 2016-11-09 DIAGNOSIS — M25561 Pain in right knee: Secondary | ICD-10-CM | POA: Insufficient documentation

## 2016-11-09 DIAGNOSIS — R110 Nausea: Secondary | ICD-10-CM | POA: Insufficient documentation

## 2016-11-09 DIAGNOSIS — I10 Essential (primary) hypertension: Secondary | ICD-10-CM | POA: Insufficient documentation

## 2016-11-09 DIAGNOSIS — M2391 Unspecified internal derangement of right knee: Secondary | ICD-10-CM | POA: Insufficient documentation

## 2016-11-09 DIAGNOSIS — R05 Cough: Secondary | ICD-10-CM | POA: Insufficient documentation

## 2016-11-09 MED ORDER — IBUPROFEN 200 MG PO TABS
600.0000 mg | ORAL_TABLET | Freq: Once | ORAL | Status: AC
  Administered 2016-11-09: 600 mg via ORAL
  Filled 2016-11-09: qty 3

## 2016-11-09 MED ORDER — IBUPROFEN 600 MG PO TABS: 600.0000 mg | ORAL_TABLET | Freq: Four times a day (QID) | ORAL | 0 refills | Status: AC | PRN

## 2016-11-09 NOTE — Discharge Instructions (Signed)
Please see the information and instructions below regarding your visit.  Your diagnoses today include:  1. Right calf pain   2. Acute pain of right knee    Your symptoms are likely caused by a tear in the muscle that inserts at the back of the knee. This can occur with an acute tear changing direction with your knee. We would like you to follow up with orthopedics. Please weight-bear on this as tolerated.  Your testing was reassuring that there is not an acute bony or or vascular abnormality in your knee.  Tests performed today include: See side panel of your discharge paperwork for testing performed today. Vital signs are listed at the bottom of these instructions.  -Knee xray -Ultrasound of lower extremity  Medications prescribed:    Take any prescribed medications only as prescribed, and any over the counter medications only as directed on the packaging.  You are prescribed ibuprofen, a non-steroidal anti-inflammatory agent (NSAID) for pain. You may take  every 6 hours as needed for pain. If still requiring this medication around the clock for acute pain after 10 days, please see your primary healthcare provider.  You may combine this medication with Tylenol, 650 mg every 6 hours, so you are receiving something for pain every 3 hours.  This is not a long-term medication unless under the care and direction of your primary provider. Taking this medication long-term and not under the supervision of a healthcare provider could increase the risk of stomach ulcers, kidney problems, and cardiovascular problems such as high blood pressure.   Home care instructions:  Please follow any educational materials contained in this packet.   Follow-up instructions: Please follow-up with your primary care provider as needed for further evaluation of your symptoms if they are not completely improved.   Please follow up with Orthopedics in if your symptoms are not resolving within 5-7  days.  Return instructions:  Please return to the Emergency Department if you experience worsening symptoms.  Please return to the emergency department for any acute worsening pain, swelling of the knee, swelling of your right lower extremity, redness of the knee or leg, fever or chills in the setting of this pain. Please return if you have any other emergent concerns.  Additional Information: To find a primary care or specialty doctor please call [phone] or [phone] to access "[facility] Find a Doctor Service."  You may also go on the [facility] website at [email or web address]  There are also multiple [facility] [place] and [facility] practices throughout the [place] that are frequently accepting new patients. You may find a clinic that is close to your home and contact them.  [facility] - [address] [place] [phone]  [facility] in [place] (also locations in [place] and [place]) - [address] [employer] [place] [phone]  [facility] - [address] [phone]    Your vital signs today were: BP 130/79 (BP Location: Right Arm)    Pulse 80    Temp 98.7 F (37.1 C) (Oral)    Resp 18    LMP 10/13/2016    SpO2 97%  If your blood pressure (BP) was elevated on multiple readings during this visit above 130 for the top number or above 80 for the bottom number, please have this repeated by your primary care provider within one month. --------------  Thank you for allowing [place] to participate in your care today.

## 2016-11-09 NOTE — ED Triage Notes (Signed)
Per [facility] patient from work where she was working when right knee locked up on her and unable to move it. Patient has ice pack on right knee.

## 2016-11-09 NOTE — ED Provider Notes (Signed)
[facility]-EMERGENCY DEPT Provider Note   CSN: [id number] Arrival date & time: 11/09/16  1305     History   Chief Complaint Chief Complaint  Patient presents with  . right knee locked up     HPI Caroline Taylor is a 31 y.o. female.  HPI   Patient is a 31 year old female with a history of anxiety and depression presenting for acute right knee and posterior calf pain. 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 Patient reports that this has occurred in the past, and she is usually able to relieve her symptoms with massaging the knee. Patient feels that her knee is "locked up". Patient was not ambulatory at the scene. Patient reports that with movement she feels a sharp pain down her leg along with paresthesias radiating up her leg. Patient reports that she has sensation in her distal right lower extremity, and she does not muscular weakness, but is having difficulty moving her knee and ankle due to pain. Patient denies any fever or chills. Patient denies any unilateral leg swelling proceeding this incident, recent immobilization, treatment for cancer, or estrogen use. No personal history of DVT, or family history of DVT.  Past Medical History:  Diagnosis Date  . Anemia   . Anxiety   . Depression   . Pregnancy induced hypertension     Patient Active Problem List   Diagnosis Date Noted  . Active labor 04/13/2013    Past Surgical History:  Procedure Laterality Date  . NO PAST SURGERIES      OB History    Gravida Para Term Preterm AB Living   SAB TAB Ectopic Multiple Live Births           2       Home Medications    Prior to Admission medications   Medication Sig Start Date End Date Taking? Authorizing Provider  ibuprofen (ADVIL,MOTRIN) 600 MG tablet Take 1 tablet (600 mg total) by mouth every 6 (six) hours as needed. 11/09/16   Aviva Kluver B, PA-C  norgestimate-ethinyl estradiol  (ORTHO-CYCLEN,SPRINTEC,PREVIFEM) 0.25-35 MG-MCG tablet Take 1 tablet by mouth daily. 09/17/14   Currie Paris, NP    Family History Family History  Problem Relation Age of Onset  . Diabetes Brother   . Heart failure Other     Social History Social History  Substance Use Topics  . Smoking status: Never Smoker  . Smokeless tobacco: Never Used  . Alcohol use No     Allergies   Calamine   Review of Systems Review of Systems  Constitutional: Negative for chills and fever.  Respiratory: Positive for cough.   Cardiovascular: Negative for leg swelling.  Gastrointestinal: Positive for nausea.  Musculoskeletal: Positive for arthralgias and myalgias.  Skin: Negative for color change.  Neurological: Negative for weakness and numbness.     Physical Exam Updated Vital Signs BP 132/84 (BP Location: Right Arm)   Pulse 82   Temp 98.7 F (37.1 C) (Oral)   Resp 18   LMP 10/13/2016   SpO2 98%   Physical Exam  Constitutional: She appears well-developed and well-nourished. No distress.  Sitting comfortably in bed.  HENT:  Head: Normocephalic and atraumatic.  Eyes: Conjunctivae are normal. Right eye exhibits no discharge. Left eye exhibits no discharge.  EOMs normal to gross examination.  Neck: Normal range of motion.  Cardiovascular: Normal rate and  regular rhythm.   Intact, 2+ DP and PT pulses in RLE.  Pulmonary/Chest:  Normal respiratory effort. Patient converses comfortably. No audible wheeze or stridor.  Abdominal: She exhibits no distension.  Musculoskeletal: Normal range of motion.  Tenderness to palpation of right medial joint line and popliteal region, particularly medial head of gastrocnemius. No effusion or erythema of right knee. Range of motion of right knee limited to passive flexion and extension due to pain. No tenderness to palpation of right ankle. Active range of motion present with flexion, extension, inversion, and eversion. Flexion and extension of right  ankle produces proximal calf pain. Achilles tendon intact.  Tenderness to palpation of right gastrocnemius.   Neurological: She is alert.  Cranial nerves intact to gross observation. SENSORY: sensation is intact to light touch in:  Superficial peroneal nerve distribution (over dorsum of foot) Deep peroneal nerve distribution (over first dorsal web space) Sural nerve distribution (over lateral aspect 5th metatarsal) Saphenous nerve distribution (over medial instep) MOTOR:  + Motor EHL (great toe dorsiflexion) + FHL (great toe plantar flexion)  + TA (ankle dorsiflexion)  + GSC (ankle plantar flexion)   Skin: Skin is warm and dry. She is not diaphoretic.  Psychiatric: She has a normal mood and affect. Her behavior is normal. Judgment and thought content normal.  Nursing note and vitals reviewed.    ED Treatments / Results  Labs (all labs ordered are listed, but only abnormal results are displayed) Labs Reviewed - No data to display  EKG  EKG Interpretation None       Radiology Dg Knee Complete 4 Views Right  Result Date: 11/09/2016 CLINICAL DATA:  Right knee pain. EXAM: RIGHT KNEE - COMPLETE 4+ VIEW COMPARISON:  None. FINDINGS: No evidence of fracture, dislocation, or joint effusion. No evidence of arthropathy or other focal bone abnormality. Soft tissues are unremarkable. IMPRESSION: Negative. Electronically Signed   By: Ted Mcalpine M.D.   On: 11/09/2016 14:05    Procedures Procedures (including critical care time)  Medications Ordered in ED Medications  ibuprofen (ADVIL,MOTRIN) tablet 600 mg (600 mg Oral Given 11/09/16 1504)     Initial Impression / Assessment and Plan / ED Course  I have reviewed the triage vital signs and the nursing notes.  Pertinent labs & imaging results that were available during my care of the patient were reviewed by me and considered in my medical decision making (see chart for details).     Final Clinical Impressions(s) / ED  Diagnoses   Final diagnoses:  Right calf pain  Acute pain of right knee   MDM  Differential diagnosis includes gastrocnemius strain, ruptured Baker's cyst, DVT, acute hemarthrosis of the right knee, patellar dislocation. Patient's symptoms improving during course of ED stay. Pain control with ibuprofen. X-ray negative for effusion or bony abnormality. Ultrasound of lower extremity reveals no evidence of DVT or ruptured Baker's cyst. Patient is low risk for DVT lower externally per Well's criteria for DVT. Suspect acute strain of gastrocnemius. Patient's pain to palpation of the insertion site of medial head, and pain with flexion and extension of ankle is consistent with this etiology. Knee immobilized in emergency department and dispensed crutches. Patient given instructions to follow-up with orthopedics if no improvement in 5-7 days. Patient given instructions for passive exercises of the right gastrocnemius and instructions to ice, rest, elevate, take ibuprofen, and weight-bear as tolerated. Patient is in understanding and agrees with the plan of care.  This is a supervised visit with Dr. Belinda Block.  Evaluation, management, and discharge planning discussed with this attending physician.  New Prescriptions Discharge Medication List as of 11/09/2016  4:44 PM       Delia Chimes 11/09/16 Duke Salvia, MD 11/13/16 1023

## 2016-11-09 NOTE — Progress Notes (Signed)
*  PRELIMINARY RESULTS* Vascular Ultrasound Right lower extremity venous duplex has been completed.  Preliminary findings: No evidence of deep vein thrombosis or baker's cysts in the right lower extremity.   Chauncey Fischer 11/09/2016, 4:13 PM

## 2017-01-12 ENCOUNTER — Emergency Department (HOSPITAL_COMMUNITY)
Admission: EM | Admit: 2017-01-12 | Discharge: 2017-01-12 | Disposition: A | Discharge status: Home / Self Care | Payer: Self-pay | Attending: Emergency Medicine | Admitting: Emergency Medicine

## 2017-01-12 ENCOUNTER — Encounter (HOSPITAL_COMMUNITY): Payer: Self-pay

## 2017-01-12 DIAGNOSIS — J020 Streptococcal pharyngitis: Secondary | ICD-10-CM | POA: Insufficient documentation

## 2017-01-12 DIAGNOSIS — Z79899 Other long term (current) drug therapy: Secondary | ICD-10-CM | POA: Insufficient documentation

## 2017-01-12 LAB — RAPID STREP SCREEN (MED CTR MEBANE ONLY): STREPTOCOCCUS, GROUP A SCREEN (DIRECT): POSITIVE — AB

## 2017-01-12 MED ORDER — PENICILLIN V POTASSIUM 250 MG PO TABS
500.0000 mg | ORAL_TABLET | Freq: Once | ORAL | Status: AC
  Administered 2017-01-12: 500 mg via ORAL
  Filled 2017-01-12: qty 2

## 2017-01-12 MED ORDER — PENICILLIN V POTASSIUM 500 MG PO TABS: 500.0000 mg | ORAL_TABLET | Freq: Two times a day (BID) | ORAL | 0 refills | Status: AC

## 2017-01-12 NOTE — Discharge Instructions (Signed)
Please read and follow all provided instructions.  Your diagnoses today include:  1. Strep pharyngitis     Tests performed today include: Strep test: was POSITIVE for strep throat Vital signs. See below for your results today.   Medications prescribed:  You were given a pill of penicillin.  You will have to finish a 10-day course of penicillin.  Please take all of your antibiotics until finished.   You may develop abdominal discomfort or nausea from the antibiotic. If this occurs, you may take it with food. Some patients also get diarrhea with antibiotics. You may help offset this with probiotics which you can buy or get in yogurt. Do not eat or take the probiotics until 2 hours after your antibiotic. Some women develop vaginal yeast infections after antibiotics. If you develop unusual vaginal discharge after being on this medication, please see your primary care provider.   Some people develop allergies to antibiotics. Symptoms of antibiotic allergy can be mild and include a flat rash and itching. They can also be more serious and include:  ?Hives - Hives are raised, red patches of skin that are usually very itchy.  ?Lip or tongue swelling  ?Trouble swallowing or breathing  ?Blistering of the skin or mouth.  If you have any of these serious symptoms, please seek emergency medical care immediately.    Take any medications prescribed only as directed.   Home care instructions:  Please read the educational materials provided and follow any instructions contained in this packet.  Follow-up instructions: Please follow-up with your primary care provider as needed for further evaluation of your symptoms.  Return instructions:  Please return to the Emergency Department if you experience worsening symptoms.  Return if you have worsening problems swallowing, your neck becomes swollen, you cannot swallow your saliva or your voice becomes muffled.  Return with high persistent fever,  persistent vomiting, or if you have trouble breathing.  Please return if you have any other emergent concerns.  Additional Information:  Your vital signs today were: BP 127/83 (BP Location: Right Arm)    Pulse 98    Temp 98.7 F (37.1 C) (Oral)    Resp 20    SpO2 98%  If your blood pressure (BP) was elevated above 135/85 this visit, please have this repeated by your doctor within one month.

## 2017-01-12 NOTE — ED Notes (Signed)
ED Provider at bedside. 

## 2017-01-12 NOTE — ED Triage Notes (Signed)
Patient complains of sore throat, earache, body aches and fever that started yesterday. Reports has ibuprofen 600 mg pta. Alert and oriented, NAD

## 2017-01-12 NOTE — ED Provider Notes (Signed)
[facility] Provider Note   CSN: [id number] Arrival date & time: 01/12/17  [id number]     History   Chief Complaint No chief complaint on file.   HPI Caroline Taylor is a 31 y.o. female.  HPI   Patient is a 31 year old female with a history of anemia, anxiety, depression, and pregnancy-induced hypertension presenting for sore throat, fevers, and myalgias for 24 hours.  Patient reports she has been taking ibuprofen and Tylenol to reduce the fever, but she noted her fever was 100.3 at 1:30 AM this morning.  Patient also has diffuse body aches.  Patient also reports bilateral earaches with a sensation of fullness in her ears.  Patient denies any stridor, difficulty swallowing, or obstructive airway.  No submandibular tenderness per patient.  Past Medical History:  Diagnosis Date  . Anemia   . Anxiety   . Depression   . Pregnancy induced hypertension     Patient Active Problem List   Diagnosis Date Noted  . Active labor 04/13/2013    Past Surgical History:  Procedure Laterality Date  . NO PAST SURGERIES      OB History    Gravida Para Term Preterm AB Living   2 2 1     2    SAB TAB Ectopic Multiple Live Births           2       Home Medications    Prior to Admission medications   Medication Sig Start Date End Date Taking? Authorizing Provider  ibuprofen (ADVIL,MOTRIN) 600 MG tablet Take 1 tablet (600 mg total) by mouth every 6 (six) hours as needed. 11/09/16   Aviva KluverMurray, Alyssa B, PA-C  norgestimate-ethinyl estradiol (ORTHO-CYCLEN,SPRINTEC,PREVIFEM) 0.25-35 MG-MCG tablet Take 1 tablet by mouth daily. 09/17/14   Currie ParisBurleson, Terri L, NP  penicillin v potassium (VEETID) 500 MG tablet Take 1 tablet (500 mg total) by mouth 2 (two) times daily for 10 days. 01/12/17 01/22/17  Elisha PonderMurray, Alyssa B, PA-C    Family History Family History  Problem Relation Age of Onset  . Diabetes Brother   . Heart failure Other     Social History Social History    Tobacco Use  . Smoking status: Never Smoker  . Smokeless tobacco: Never Used  Substance Use Topics  . Alcohol use: No  . Drug use: No     Allergies   Calamine   Review of Systems Review of Systems  Constitutional: Positive for chills and fever.  HENT: Positive for congestion, rhinorrhea and sore throat. Negative for facial swelling, trouble swallowing and voice change.   Respiratory: Negative for wheezing and stridor.   Musculoskeletal: Positive for myalgias.     Physical Exam Updated Vital Signs BP 127/83 (BP Location: Right Arm)   Pulse 98   Temp 98.7 F (37.1 C) (Oral)   Resp 20   SpO2 98%   Physical Exam  Constitutional: She appears well-developed and well-nourished. No distress.  Sitting comfortably in bed.  HENT:  Head: Normocephalic and atraumatic.  Normal phonation. No muffled voice sounds. Patient swallows secretions without difficulty. Dentition normal. No lesions of tongue or buccal mucosa. Uvula midline. No asymmetric swelling of the posterior pharynx.+ Erythema of posterior pharynx. There is b/l tonsillar exuduate. No lingual swelling. No induration inferior to tongue. No submandibular tenderness, swelling, or induration.  Tissues of the neck supple. No cervical lymphadenopathy. Right TM without erythema or effusion; left TM without erythema or effusion.  Eyes: Conjunctivae are normal. Right eye exhibits no  discharge. Left eye exhibits no discharge.  EOMs normal to gross examination.  Neck: Normal range of motion.  Cardiovascular: Normal rate, regular rhythm and normal heart sounds.  Pulmonary/Chest: Effort normal and breath sounds normal. No respiratory distress. She has no wheezes. She has no rales.  Normal respiratory effort. Patient converses comfortably. No audible wheeze or stridor.  Abdominal: She exhibits no distension.  Musculoskeletal: Normal range of motion.  Lymphadenopathy:    She has no cervical adenopathy.  Neurological: She is alert.   Cranial nerves intact to gross observation. Patient moves extremities without difficulty.  Skin: Skin is warm and dry. She is not diaphoretic.  Psychiatric: She has a normal mood and affect. Her behavior is normal. Judgment and thought content normal.  Nursing note and vitals reviewed.    ED Treatments / Results  Labs (all labs ordered are listed, but only abnormal results are displayed) Labs Reviewed  RAPID STREP SCREEN (NOT AT [facility]) - Abnormal; Notable for the following components:      Result Value   Streptococcus, Group A Screen (Direct) POSITIVE (*)    All other components within normal limits    EKG  EKG Interpretation None       Radiology No results found.  Procedures Procedures (including critical care time)  Medications Ordered in ED Medications  penicillin v potassium (VEETID) tablet 500 mg (not administered)     Initial Impression / Assessment and Plan / ED Course  I have reviewed the triage vital signs and the nursing notes.  Pertinent labs & imaging results that were available during my care of the patient were reviewed by me and considered in my medical decision making (see chart for details).     Final Clinical Impressions(s) / ED Diagnoses   Final diagnoses:  Strep pharyngitis   Caroline Taylor is a 31 y.o. female who presents to ED for  sore throat. Patient nontoxic appearing and in no acute distress. Rapid strep positive.  Presentation not concerning for PTA or infection spread to soft tissue. No trismus or uvula deviation. Patient with normal phonation. Exam demonstrates soft neck tissue, no swelling or induration inferior to the tongue or in the submandibular space  NSAIDs, pain medication. Rx for Penicillin VK.  Patient declined treatment with Decadron.  Patient able to drink water in ED without difficulty with intact air way. Specific return precautions discussed for change in voice, inability to tolerate secretions, difficulty breathing or  swallowing, or increased nausea or vomiting. Discussed importance of hydration. Recommended PCP follow up. All questions answered.   ED Discharge Orders        Ordered    penicillin v potassium (VEETID) 500 MG tablet  2 times daily     01/12/17 1031       Delia ChimesMurray, Alyssa B, PA-C 01/12/17 1033    Gerhard MunchLockwood, Robert, MD 01/12/17 [phone]

## 2017-08-13 ENCOUNTER — Ambulatory Visit (HOSPITAL_COMMUNITY)
Admission: EM | Admit: 2017-08-13 | Discharge: 2017-08-13 | Disposition: A | Discharge status: Home / Self Care | Payer: Self-pay | Attending: Internal Medicine | Admitting: Internal Medicine

## 2017-08-13 ENCOUNTER — Encounter (HOSPITAL_COMMUNITY): Payer: Self-pay | Admitting: Emergency Medicine

## 2017-08-13 DIAGNOSIS — S40862A Insect bite (nonvenomous) of left upper arm, initial encounter: Secondary | ICD-10-CM

## 2017-08-13 DIAGNOSIS — W57XXXA Bitten or stung by nonvenomous insect and other nonvenomous arthropods, initial encounter: Secondary | ICD-10-CM

## 2017-08-13 MED ORDER — DOXYCYCLINE HYCLATE 100 MG PO CAPS: 100.0000 mg | ORAL_CAPSULE | Freq: Two times a day (BID) | ORAL | 0 refills | Status: DC

## 2017-08-13 MED ORDER — TRIAMCINOLONE ACETONIDE 0.1 % EX CREA: 1.0000 "application " | TOPICAL_CREAM | Freq: Two times a day (BID) | CUTANEOUS | 0 refills | Status: AC

## 2017-08-13 MED ORDER — DIPHENHYDRAMINE HCL 25 MG PO TABS: 25.0000 mg | ORAL_TABLET | Freq: Four times a day (QID) | ORAL | 0 refills | Status: AC

## 2017-08-13 NOTE — ED Triage Notes (Signed)
Pt here for insect bite to left arm with redness and swelling

## 2017-08-13 NOTE — Discharge Instructions (Addendum)
It was nice meeting you!!  I believe that you may have a spider bite. We are going to treat you with some antibiotics for infection and give you some cream for inflammation and itching.  You may continue to take benadryl as needed. Please monitor for worsening symptoms and increased pain or spreading.  Follow up if no improvement in the next 2 days.

## 2017-08-13 NOTE — ED Provider Notes (Signed)
[facility]    CSN: [id number] Arrival date & time: 08/13/17  1028     History   Chief Complaint Chief Complaint  Patient presents with  . Insect Bite    HPI Caroline Taylor is a 32 y.o. female.   Patient is a 32 year old female that presents today with insect bite to left upper arm.  She noticed it yesterday upon waking up.  Reports very itchy and slightly painful.  Throughout the day began to worsen with more pain and increased erythema to the upper arm with spreading.  She noticed to punctures with oozing of drainage and was concerned about a possible spider bite.  She has been outside and thought initially it was a mosquito bite that has become inflamed.  She has been using cortisone cream and p.o. Benadryl with minimal relief of symptoms.  She denies any fever, chills, body aches, joint aches, nausea, headache, dizziness.  ROS per HPI      Past Medical History:  Diagnosis Date  . Anemia   . Anxiety   . Depression   . Pregnancy induced hypertension     Patient Active Problem List   Diagnosis Date Noted  . Active labor 04/13/2013    Past Surgical History:  Procedure Laterality Date  . NO PAST SURGERIES      OB History    Gravida  2   Para  2   Term  1   Preterm      AB      Living  2     SAB      TAB      Ectopic      Multiple      Live Births  2            Home Medications    Prior to Admission medications   Medication Sig Start Date End Date Taking? Authorizing Provider  ibuprofen (ADVIL,MOTRIN) 600 MG tablet Take 1 tablet (600 mg total) by mouth every 6 (six) hours as needed. 11/09/16   Aviva KluverMurray, Alyssa B, PA-C  norgestimate-ethinyl estradiol (ORTHO-CYCLEN,SPRINTEC,PREVIFEM) 0.25-35 MG-MCG tablet Take 1 tablet by mouth daily. 09/17/14   Currie ParisBurleson, Terri L, NP    Family History Family History  Problem Relation Age of Onset  . Diabetes Brother   . Heart failure Other     Social History Social History   Tobacco Use    . Smoking status: Never Smoker  . Smokeless tobacco: Never Used  Substance Use Topics  . Alcohol use: No  . Drug use: No     Allergies   Calamine   Review of Systems Review of Systems   Physical Exam Triage Vital Signs ED Triage Vitals [08/13/17 1044]  Enc Vitals Group     BP 140/90     Pulse Rate 78     Resp 18     Temp 98.7 F (37.1 C)     Temp Source Oral     SpO2 100 %     Weight      Height      Head Circumference      Peak Flow      Pain Score      Pain Loc      Pain Edu?      Excl. in GC?    No data found.  Updated Vital Signs BP 140/90 (BP Location: Right Arm)   Pulse 78   Temp 98.7 F (37.1 C) (Oral)   Resp 18   SpO2 100%  Visual Acuity Right Eye Distance:   Left Eye Distance:   Bilateral Distance:    Right Eye Near:   Left Eye Near:    Bilateral Near:     Physical Exam  Constitutional: She is oriented to person, place, and time. She appears well-developed and well-nourished.  HENT:  Head: Normocephalic.  Neck: Normal range of motion.  Pulmonary/Chest: Effort normal.  Neurological: She is alert and oriented to person, place, and time.  Skin: Skin is warm. Capillary refill takes less than 2 seconds. There is erythema.  2 small punctures proximal to the left elbow with serous drainage, surrounding erythema and spreading into the upper arm.  Warm to touch with mild induration.     UC Treatments / Results  Labs (all labs ordered are listed, but only abnormal results are displayed) Labs Reviewed - No data to display  EKG None  Radiology No results found.  Procedures Procedures (including critical care time)  Medications Ordered in UC Medications - No data to display  Initial Impression / Assessment and Plan / UC Course  I have reviewed the triage vital signs and the nursing notes.  Pertinent labs & imaging results that were available during my care of the patient were reviewed by me and considered in my medical decision  making (see chart for details).         Based on presentation treating for possible spider bite with doxycycline.  Triamcinolone cream for itching and inflammation.  Benadryl p.o. as needed.  Follow-up if not better in the next couple of days or sooner if it begins to spread more.  Final Clinical Impressions(s) / UC Diagnoses   Final diagnoses:  None   Discharge Instructions   None    ED Prescriptions    None     Controlled Substance Prescriptions [place] Controlled Substance Registry consulted? Not Applicable   Janace Aris, NP 08/13/17 1304

## 2017-08-13 NOTE — ED Notes (Signed)
Patient states she took Benadryl at approx. 10:15 this morning.

## 2017-09-24 ENCOUNTER — Encounter (HOSPITAL_COMMUNITY): Payer: Self-pay | Admitting: Emergency Medicine

## 2017-09-24 ENCOUNTER — Ambulatory Visit (HOSPITAL_COMMUNITY)
Admission: EM | Admit: 2017-09-24 | Discharge: 2017-09-24 | Disposition: A | Discharge status: Home / Self Care | Payer: Self-pay | Attending: Family Medicine | Admitting: Family Medicine

## 2017-09-24 DIAGNOSIS — R21 Rash and other nonspecific skin eruption: Secondary | ICD-10-CM

## 2017-09-24 MED ORDER — METHYLPREDNISOLONE SODIUM SUCC 125 MG IJ SOLR
INTRAMUSCULAR | Status: AC
  Filled 2017-09-24: qty 2

## 2017-09-24 MED ORDER — PREDNISONE 50 MG PO TABS: 50.0000 mg | ORAL_TABLET | Freq: Every day | ORAL | 0 refills | Status: AC

## 2017-09-24 MED ORDER — METHYLPREDNISOLONE SODIUM SUCC 125 MG IJ SOLR
125.0000 mg | Freq: Once | INTRAMUSCULAR | Status: AC
  Administered 2017-09-24: 125 mg via INTRAMUSCULAR

## 2017-09-24 NOTE — ED Triage Notes (Signed)
Pt here for generalized body rash with itching starting yesterday

## 2017-09-24 NOTE — ED Provider Notes (Signed)
[facility]    CSN: [id number] Arrival date & time: 09/24/17  1017     History   Chief Complaint Chief Complaint  Patient presents with  . Rash    HPI Caroline Taylor is a 32 y.o. female no protruding past medical history presenting today for evaluation of a rash.  Patient states that yesterday afternoon she started to develop a rash to her neck, and has since spread to her face as well as upper extremities and trunk.  Rash is associated with itching.  She took Benadryl as well as 20 mg of prednisone which she had leftover from a previous spider bite.  She is unsure of any new exposures, denies new food, medications, lotions, detergents, hygiene products.  Denies shortness of breath or chest pain, difficulty breathing.  HPI  Past Medical History:  Diagnosis Date  . Anemia   . Anxiety   . Depression   . Pregnancy induced hypertension     Patient Active Problem List   Diagnosis Date Noted  . Active labor 04/13/2013    Past Surgical History:  Procedure Laterality Date  . NO PAST SURGERIES      OB History    Gravida  2   Para  2   Term  1   Preterm      AB      Living  2     SAB      TAB      Ectopic      Multiple      Live Births  2            Home Medications    Prior to Admission medications   Medication Sig Start Date End Date Taking? Authorizing Provider  diphenhydrAMINE (BENADRYL) 25 MG tablet Take 1 tablet (25 mg total) by mouth every 6 (six) hours. 08/13/17   Dahlia Byes A, NP  ibuprofen (ADVIL,MOTRIN) 600 MG tablet Take 1 tablet (600 mg total) by mouth every 6 (six) hours as needed. 11/09/16   Aviva Kluver B, PA-C  norgestimate-ethinyl estradiol (ORTHO-CYCLEN,SPRINTEC,PREVIFEM) 0.25-35 MG-MCG tablet Take 1 tablet by mouth daily. 09/17/14   Burleson, Brand Males, NP  predniSONE (DELTASONE) 50 MG tablet Take 1 tablet (50 mg total) by mouth daily for 3 days. 09/24/17 09/27/17  Wieters, Hallie C, PA-C  triamcinolone cream (KENALOG) 0.1 %  Apply 1 application topically 2 (two) times daily. 08/13/17   Janace Aris, NP    Family History Family History  Problem Relation Age of Onset  . Diabetes Brother   . Heart failure Other     Social History Social History   Tobacco Use  . Smoking status: Never Smoker  . Smokeless tobacco: Never Used  Substance Use Topics  . Alcohol use: No  . Drug use: No     Allergies   Calamine   Review of Systems Review of Systems  Constitutional: Negative for fatigue and fever.  HENT: Negative for mouth sores.   Eyes: Negative for visual disturbance.  Respiratory: Negative for shortness of breath.   Cardiovascular: Negative for chest pain.  Gastrointestinal: Negative for abdominal pain, nausea and vomiting.  Genitourinary: Negative for genital sores.  Musculoskeletal: Negative for arthralgias and joint swelling.  Skin: Positive for color change and rash. Negative for wound.  Neurological: Negative for dizziness, weakness, light-headedness and headaches.     Physical Exam Triage Vital Signs ED Triage Vitals  Enc Vitals Group     BP 09/24/17 1106 123/84  Pulse Rate 09/24/17 1106 79     Resp 09/24/17 1106 18     Temp 09/24/17 1106 98.6 F (37 C)     Temp Source 09/24/17 1106 Oral     SpO2 09/24/17 1106 100 %     Weight --      Height --      Head Circumference --      Peak Flow --      Pain Score 09/24/17 1107 6     Pain Loc --      Pain Edu? --      Excl. in GC? --    No data found.  Updated Vital Signs BP 123/84 (BP Location: Left Arm)   Pulse 79   Temp 98.6 F (37 C) (Oral)   Resp 18   SpO2 100%   Visual Acuity Right Eye Distance:   Left Eye Distance:   Bilateral Distance:    Right Eye Near:   Left Eye Near:    Bilateral Near:     Physical Exam  Constitutional: She appears well-developed and well-nourished. No distress.  HENT:  Head: Normocephalic and atraumatic.  Oral mucosa pink and moist, no tonsillar enlargement or exudate. Posterior  pharynx patent and nonerythematous, no uvula deviation or swelling. Normal phonation.  Eyes: Conjunctivae are normal.  Neck: Neck supple.  Cardiovascular: Normal rate and regular rhythm.  No murmur heard. Pulmonary/Chest: Effort normal and breath sounds normal. No respiratory distress.  Abdominal: Soft. There is no tenderness.  Striae present, multiple erythematous maculopapular areas  Musculoskeletal: She exhibits no edema.  Neurological: She is alert.  Skin: Skin is warm and dry.  Erythematous, maculopapular blanchable areas diffusely to chest, lower back, upper extremities, nasal and cheek areas of face as well as around scalp line  Psychiatric: She has a normal mood and affect.  Nursing note and vitals reviewed.    UC Treatments / Results  Labs (all labs ordered are listed, but only abnormal results are displayed) Labs Reviewed - No data to display  EKG None  Radiology No results found.  Procedures Procedures (including critical care time)  Medications Ordered in UC Medications  methylPREDNISolone sodium succinate (SOLU-MEDROL) 125 mg/2 mL injection 125 mg (has no administration in time range)    Initial Impression / Assessment and Plan / UC Course  I have reviewed the triage vital signs and the nursing notes.  Pertinent labs & imaging results that were available during my care of the patient were reviewed by me and considered in my medical decision making (see chart for details).     Rash appears allergic versus other contact dermatitis.  Will provide Solu-Medrol in clinic today, continue prednisone 30 mg for the next 3 to 5 days.  Antihistamines to help with itching.  Expect self resolution as irritant removed from body.  No involvement of mucosal surfaces.  Vital signs stable, airway patent.Discussed strict return precautions. Patient verbalized understanding and is agreeable with plan.  Final Clinical Impressions(s) / UC Diagnoses   Final diagnoses:  Rash and  nonspecific skin eruption     Discharge Instructions     Rash appears to be an allergic reaction, we gave you a shot of Solu-Medrol today Please continue prednisone daily for the next 4 to 5 days, may stop prednisone sooner if rash resolves Please take the 3 extra tablets of prednisone this evening with food, continue with the 5 leftover tablets tomorrow, and continuing with 50 mg tablets if rash still persisting  Please continue to  use antihistamines like Zyrtec or Benadryl and Pepcid for further management of itching.  Please return if rash not improving, rash spreading, developing shortness of breath or difficulty breathing, or spreading to mouth or vaginal area.   ED Prescriptions    Medication Sig Dispense Auth. Provider   predniSONE (DELTASONE) 50 MG tablet Take 1 tablet (50 mg total) by mouth daily for 3 days. 3 tablet Wieters, Hallie C, PA-C     Controlled Substance Prescriptions [place] Controlled Substance Registry consulted? Not Applicable   Lew DawesWieters, Hallie C, [place] 09/24/17 1130

## 2017-09-24 NOTE — Discharge Instructions (Signed)
Rash appears to be an allergic reaction, we gave you a shot of Solu-Medrol today Please continue prednisone daily for the next 4 to 5 days, may stop prednisone sooner if rash resolves Please take the 3 extra tablets of prednisone this evening with food, continue with the 5 leftover tablets tomorrow, and continuing with 50 mg tablets if rash still persisting  Please continue to use antihistamines like Zyrtec or Benadryl and Pepcid for further management of itching.  Please return if rash not improving, rash spreading, developing shortness of breath or difficulty breathing, or spreading to mouth or vaginal area.

## 2018-05-21 ENCOUNTER — Other Ambulatory Visit: Payer: Self-pay

## 2018-05-21 ENCOUNTER — Encounter (HOSPITAL_COMMUNITY): Payer: Self-pay

## 2018-05-21 ENCOUNTER — Ambulatory Visit (HOSPITAL_COMMUNITY)
Admission: EM | Admit: 2018-05-21 | Discharge: 2018-05-21 | Disposition: A | Discharge status: Home / Self Care | Payer: Self-pay | Attending: Internal Medicine | Admitting: Internal Medicine

## 2018-05-21 DIAGNOSIS — L02415 Cutaneous abscess of right lower limb: Secondary | ICD-10-CM

## 2018-05-21 MED ORDER — DOXYCYCLINE HYCLATE 100 MG PO CAPS: 100.0000 mg | ORAL_CAPSULE | Freq: Two times a day (BID) | ORAL | 0 refills | Status: AC

## 2018-05-21 NOTE — ED Provider Notes (Signed)
[facility]    CSN: [id number] Arrival date & time: 05/21/18  1239     History   Chief Complaint Chief Complaint  Patient presents with  . Abscess    HPI Caroline Taylor is a 33 y.o. female no contributing past medical history presenting today for evaluation of an abscess.  Patient states that she has had an abscess to her right upper thigh.  Symptoms have been present for the past 3 to 4 days.  She notes that it has began draining on its own and has drained a significant amount of drainage.  States that the redness has started to improve.  Believes this was caused after shaving and developing an ingrown hair.  Denies any fevers.  HPI  Past Medical History:  Diagnosis Date  . Anemia   . Anxiety   . Depression   . Pregnancy induced hypertension     Patient Active Problem List   Diagnosis Date Noted  . Active labor 04/13/2013    Past Surgical History:  Procedure Laterality Date  . NO PAST SURGERIES      OB History    Gravida  2   Para  2   Term  1   Preterm      AB      Living  2     SAB      TAB      Ectopic      Multiple      Live Births  2            Home Medications    Prior to Admission medications   Medication Sig Start Date End Date Taking? Authorizing Provider  ibuprofen (ADVIL,MOTRIN) 600 MG tablet Take 1 tablet (600 mg total) by mouth every 6 (six) hours as needed. 11/09/16  Yes [place] Scrape, Alyssa B, PA-C  diphenhydrAMINE (BENADRYL) 25 MG tablet Take 1 tablet (25 mg total) by mouth every 6 (six) hours. 08/13/17   Dahlia Byes A, NP  doxycycline (VIBRAMYCIN) 100 MG capsule Take 1 capsule (100 mg total) by mouth 2 (two) times daily for 10 days. 05/21/18 05/31/18  Wieters, Hallie C, PA-C  norgestimate-ethinyl estradiol (ORTHO-CYCLEN,SPRINTEC,PREVIFEM) 0.25-35 MG-MCG tablet Take 1 tablet by mouth daily. 09/17/14   Burleson, Brand Males, NP  triamcinolone cream (KENALOG) 0.1 % Apply 1 application topically 2 (two) times daily. 08/13/17   Janace Aris, NP    Family History Family History  Problem Relation Age of Onset  . Diabetes Brother   . Heart failure Other     Social History Social History   Tobacco Use  . Smoking status: Never Smoker  . Smokeless tobacco: Never Used  Substance Use Topics  . Alcohol use: No  . Drug use: No     Allergies   Calamine   Review of Systems Review of Systems  Constitutional: Negative for fatigue and fever.  Eyes: Negative for visual disturbance.  Respiratory: Negative for shortness of breath.   Cardiovascular: Negative for chest pain.  Gastrointestinal: Negative for abdominal pain, nausea and vomiting.  Genitourinary: Negative for decreased urine volume and genital sores.  Musculoskeletal: Negative for arthralgias and joint swelling.  Skin: Positive for color change and wound. Negative for rash.  Neurological: Negative for dizziness, weakness, light-headedness and headaches.     Physical Exam Triage Vital Signs ED Triage Vitals  Enc Vitals Group     BP 05/21/18 1256 132/90     Pulse Rate 05/21/18 1256 96     Resp 05/21/18  1256 18     Temp 05/21/18 1256 98.3 F (36.8 C)     Temp src --      SpO2 05/21/18 1256 96 %     Weight --      Height --      Head Circumference --      Peak Flow --      Pain Score 05/21/18 1258 8     Pain Loc --      Pain Edu? --      Excl. in GC? --    No data found.  Updated Vital Signs BP 132/90   Pulse 96   Temp 98.3 F (36.8 C)   Resp 18   LMP 05/19/2018   SpO2 96%   Visual Acuity Right Eye Distance:   Left Eye Distance:   Bilateral Distance:    Right Eye Near:   Left Eye Near:    Bilateral Near:     Physical Exam Vitals signs and nursing note reviewed.  Constitutional:      Appearance: She is well-developed.     Comments: No acute distress  HENT:     Head: Normocephalic and atraumatic.     Nose: Nose normal.  Eyes:     Conjunctiva/sclera: Conjunctivae normal.  Neck:     Musculoskeletal: Neck supple.   Cardiovascular:     Rate and Rhythm: Normal rate.  Pulmonary:     Effort: Pulmonary effort is normal. No respiratory distress.  Abdominal:     General: There is no distension.  Musculoskeletal: Normal range of motion.     Comments: Full active range of motion of hip  Skin:    General: Skin is warm and dry.     Comments: Right proximal thigh/groin region with 16 x 6 cm oval area with faint erythema, central open area approximately 2 cm x 1 cm, partially covered by thicker whitish purulent material.  Mild amount of pus elicited with manipulation of abscess; if the erythema does not extend into the labial/mons area; no significant fluctuance palpated  Neurological:     Mental Status: She is alert and oriented to person, place, and time.      UC Treatments / Results  Labs (all labs ordered are listed, but only abnormal results are displayed) Labs Reviewed - No data to display  EKG None  Radiology No results found.  Procedures Procedures (including critical care time)  Medications Ordered in UC Medications - No data to display  Initial Impression / Assessment and Plan / UC Course  I have reviewed the triage vital signs and the nursing notes.  Pertinent labs & imaging results that were available during my care of the patient were reviewed by me and considered in my medical decision making (see chart for details).    Vital signs stable. Patient with large abscess to right thigh.  Abscess already opened and draining significantly prior to arrival.  Deferring any further I&D.  Will initiate on doxycycline and have continue warm compresses to express further drainage.  Monitor for improving redness.Discussed strict return precautions. Patient verbalized understanding and is agreeable with plan.  Final Clinical Impressions(s) / UC Diagnoses   Final diagnoses:  Abscess of right thigh     Discharge Instructions     Please begin doxycycline for 10 days  Apply warm  compresses/hot rags to area with massage to express further drainage  Return if symptoms returning or not improving   ED Prescriptions    Medication Sig Dispense Auth. Provider  doxycycline (VIBRAMYCIN) 100 MG capsule Take 1 capsule (100 mg total) by mouth 2 (two) times daily for 10 days. 20 capsule Wieters, Hallie C, PA-C     Controlled Substance Prescriptions [employer] Controlled Substance Registry consulted? Not Applicable   Lew Dawes, [place] 05/21/18 1345

## 2018-05-21 NOTE — Discharge Instructions (Signed)
Please begin doxycycline for 10 days  Apply warm compresses/hot rags to area with massage to express further drainage  Return if symptoms returning or not improving

## 2018-05-21 NOTE — ED Triage Notes (Signed)
Abscess on right upper thigh x 3 days

## 2018-12-13 IMAGING — CR DG KNEE COMPLETE 4+V*R*
4 series · 4 of 4 positions shown · non-contrast
Comparison: None.

CLINICAL DATA: Right knee pain.

EXAM:
RIGHT KNEE - COMPLETE 4+ VIEW

[t knee ap right]
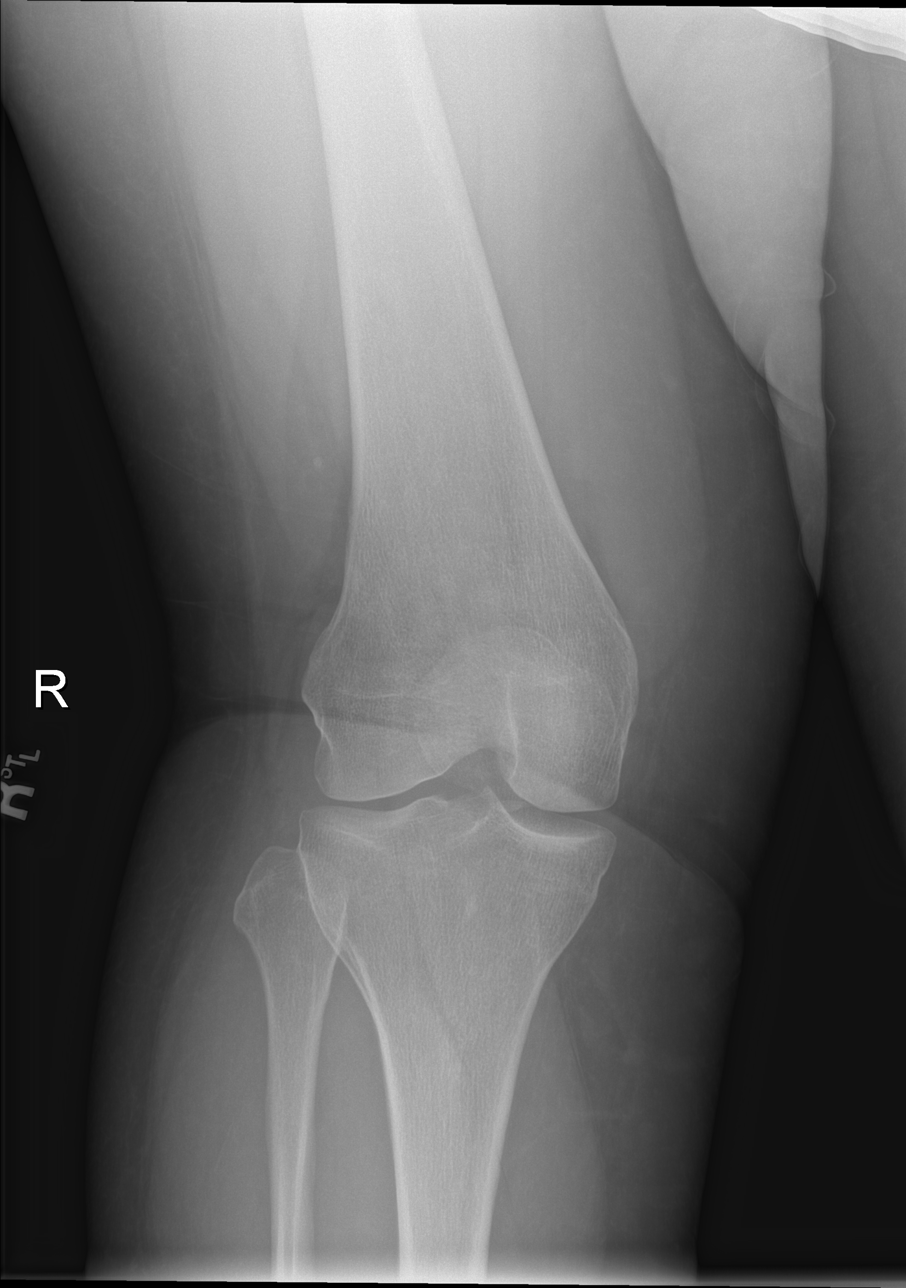

[t knee obl right (1 of 2)]
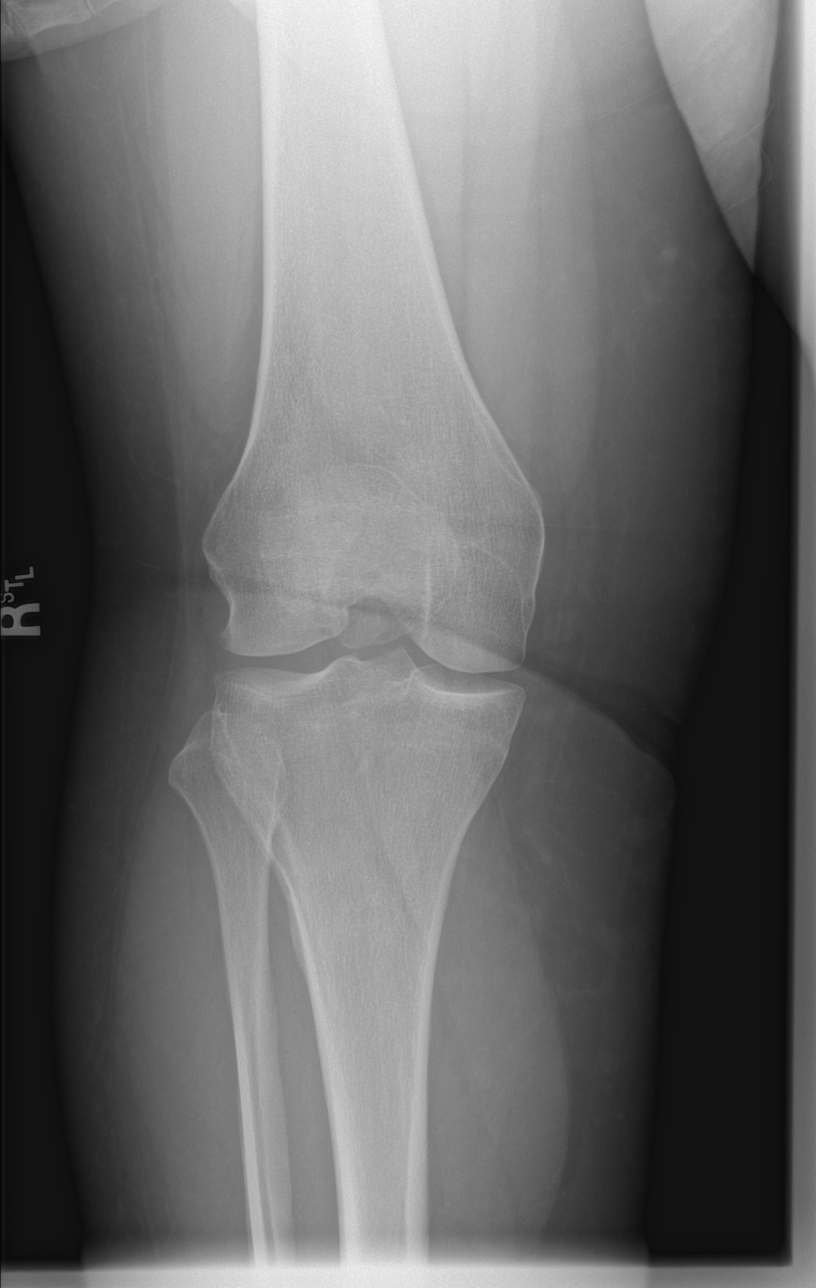

[t knee obl right (2 of 2)]
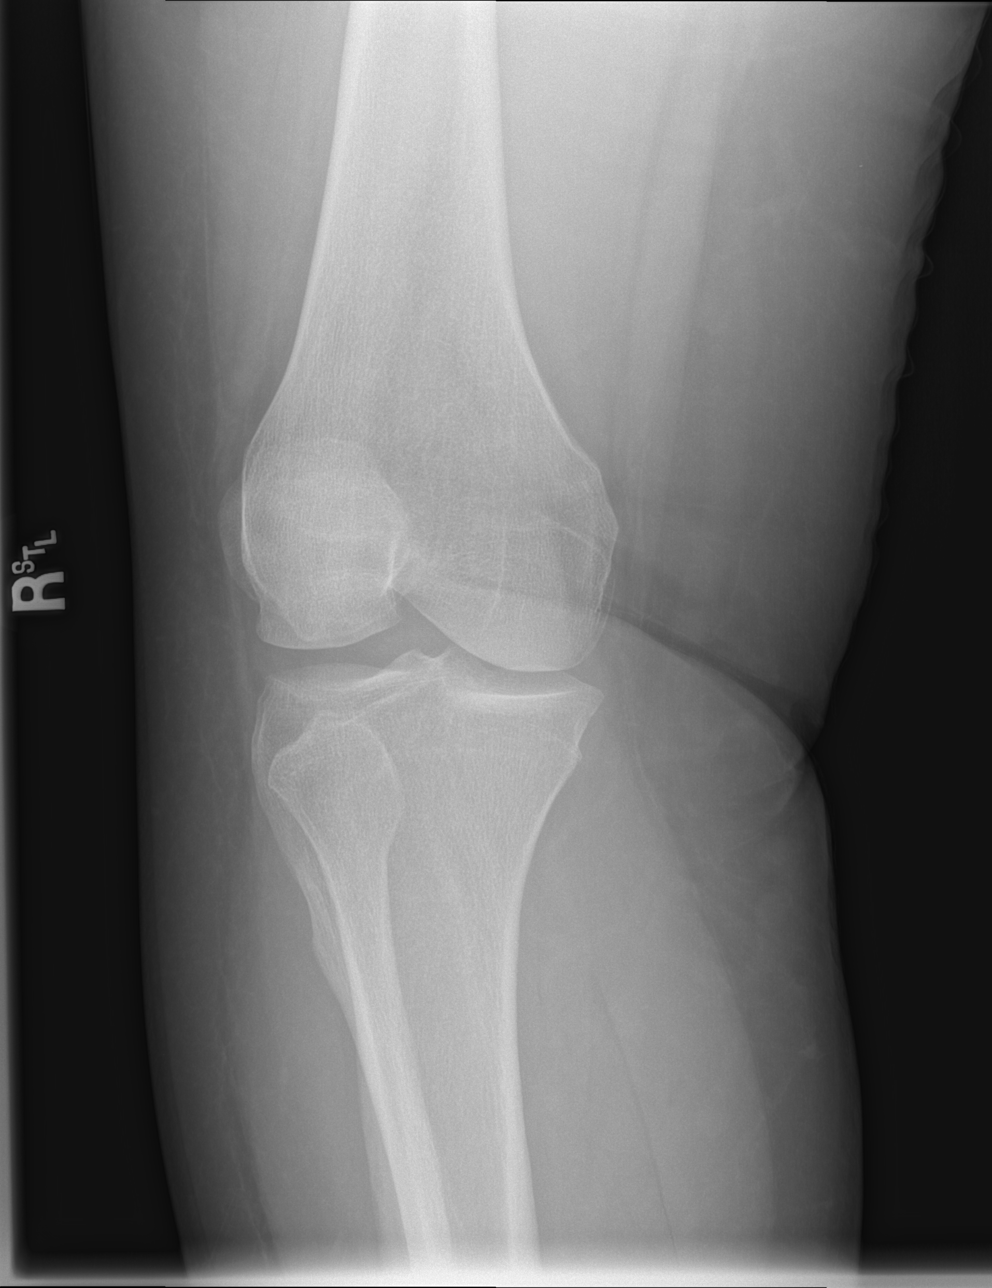

[x knee lat right]
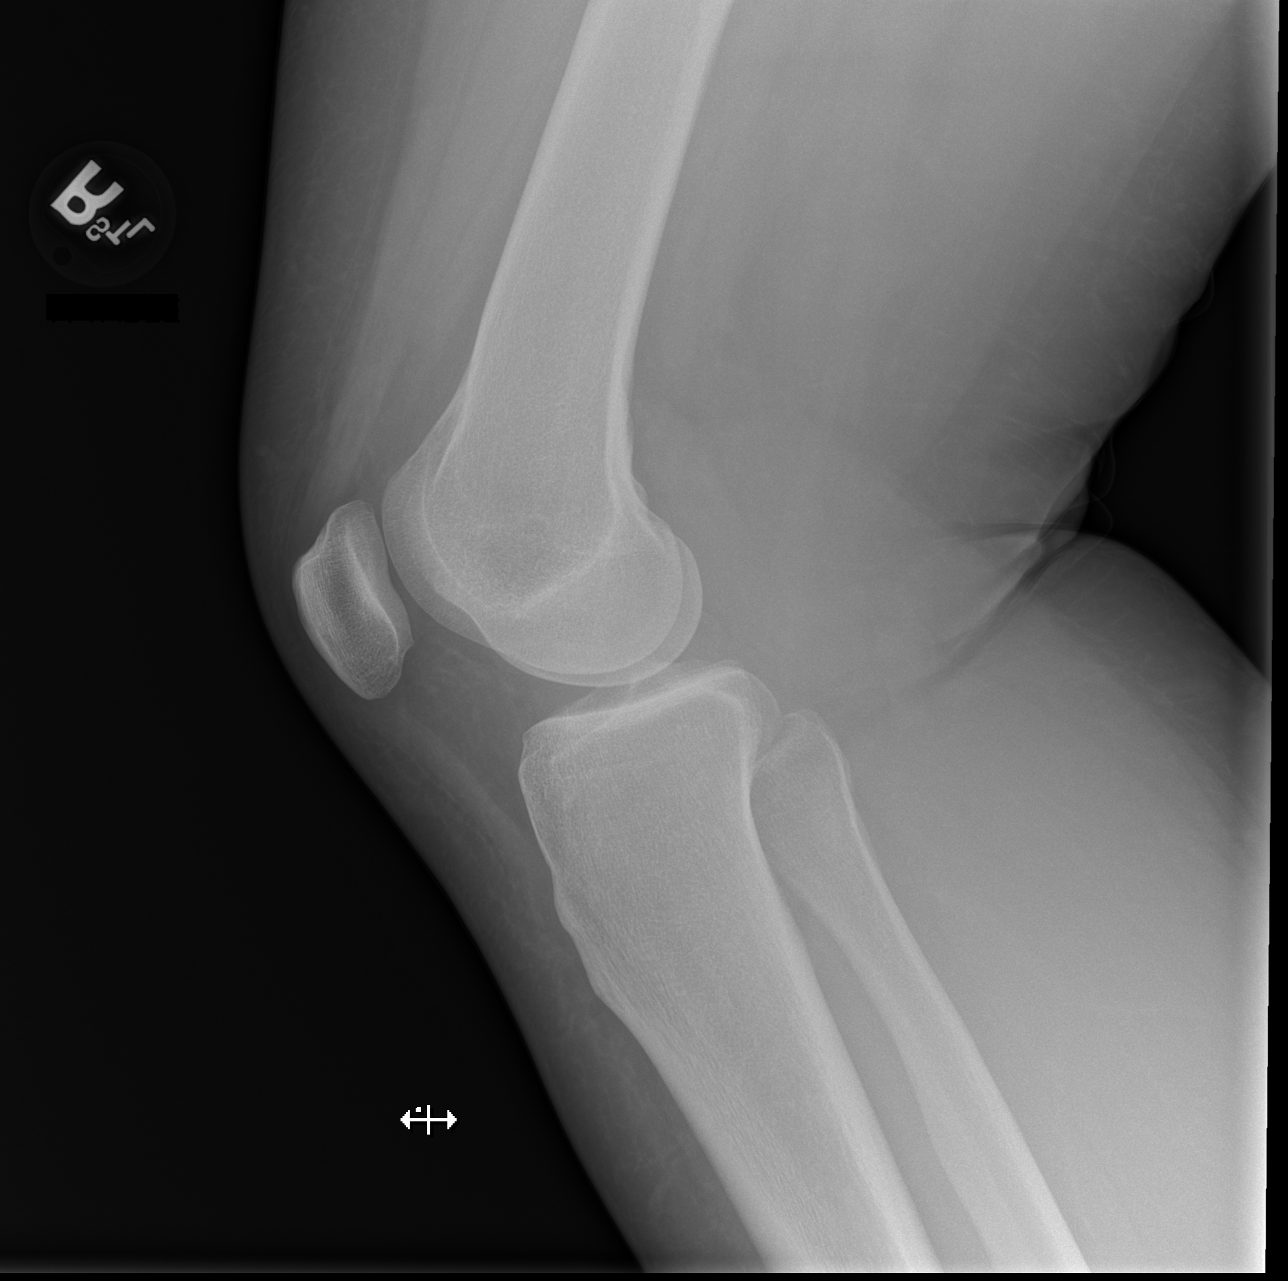

[4 of 4 positions shown; findings below may reference images not displayed]

FINDINGS: No evidence of fracture, dislocation, or joint effusion. No evidence
of arthropathy or other focal bone abnormality. Soft tissues are
unremarkable.
IMPRESSION: Negative.
